# Patient Record
Sex: Male | Born: 1960 | Race: White | Hispanic: No | Marital: Married | Smoking: Never smoker
Health system: Southern US, Community
[De-identification: ages and names within clinical notes are randomized; demographics above are authoritative.]

## PROBLEM LIST (undated history)

## (undated) DIAGNOSIS — E119 Type 2 diabetes mellitus without complications: Secondary | ICD-10-CM

## (undated) DIAGNOSIS — E785 Hyperlipidemia, unspecified: Secondary | ICD-10-CM

## (undated) HISTORY — PX: ELBOW SURGERY: SHX618

## (undated) HISTORY — PX: FINGER SURGERY: SHX640

---

## 2006-03-11 ENCOUNTER — Emergency Department: Payer: Self-pay | Admitting: Internal Medicine

## 2007-08-09 ENCOUNTER — Ambulatory Visit: Payer: Self-pay | Admitting: Internal Medicine

## 2007-08-10 ENCOUNTER — Ambulatory Visit: Payer: Self-pay | Admitting: Internal Medicine

## 2010-10-01 ENCOUNTER — Emergency Department: Payer: Self-pay | Admitting: Emergency Medicine

## 2011-01-25 ENCOUNTER — Emergency Department: Payer: Self-pay | Admitting: Emergency Medicine

## 2013-05-10 DIAGNOSIS — N529 Male erectile dysfunction, unspecified: Secondary | ICD-10-CM | POA: Insufficient documentation

## 2013-05-10 DIAGNOSIS — E6609 Other obesity due to excess calories: Secondary | ICD-10-CM | POA: Insufficient documentation

## 2013-05-10 DIAGNOSIS — E1169 Type 2 diabetes mellitus with other specified complication: Secondary | ICD-10-CM | POA: Insufficient documentation

## 2013-05-17 ENCOUNTER — Ambulatory Visit: Payer: Self-pay | Admitting: Physician Assistant

## 2014-02-13 DIAGNOSIS — E119 Type 2 diabetes mellitus without complications: Secondary | ICD-10-CM | POA: Insufficient documentation

## 2014-10-08 ENCOUNTER — Ambulatory Visit: Payer: Self-pay | Admitting: Family Medicine

## 2014-11-20 ENCOUNTER — Ambulatory Visit: Payer: Self-pay | Admitting: Family Medicine

## 2016-10-09 ENCOUNTER — Ambulatory Visit
Admission: EM | Admit: 2016-10-09 | Discharge: 2016-10-09 | Disposition: A | Discharge status: Home / Self Care | Payer: Managed Care, Other (non HMO) | Attending: Family Medicine | Admitting: Family Medicine

## 2016-10-09 DIAGNOSIS — R110 Nausea: Secondary | ICD-10-CM | POA: Diagnosis not present

## 2016-10-09 DIAGNOSIS — A09 Infectious gastroenteritis and colitis, unspecified: Secondary | ICD-10-CM | POA: Diagnosis not present

## 2016-10-09 DIAGNOSIS — R197 Diarrhea, unspecified: Secondary | ICD-10-CM

## 2016-10-09 HISTORY — DX: Type 2 diabetes mellitus without complications: E11.9

## 2016-10-09 HISTORY — DX: Hyperlipidemia, unspecified: E78.5

## 2016-10-09 MED ORDER — ONDANSETRON 8 MG PO TBDP: 8.0000 mg | ORAL_TABLET | Freq: Two times a day (BID) | ORAL | 0 refills | Status: DC

## 2016-10-09 MED ORDER — ONDANSETRON 8 MG PO TBDP
8.0000 mg | ORAL_TABLET | Freq: Once | ORAL | Status: AC
  Administered 2016-10-09: 8 mg via ORAL

## 2016-10-09 NOTE — ED Triage Notes (Signed)
Patient complains of nausea and vomiting with diarrhea that occurred last night around 3:40am. Patient reports last emesis at 5am. Patient reports that he went to dinner last night and was feeling fine, went home and went to sleep and then woke up with symptoms. Patient reports that he just feels nauseated now.

## 2016-10-09 NOTE — ED Provider Notes (Signed)
CSN: [id number]     Arrival date & time 10/09/16  2956 History   First MD Initiated Contact with Patient 10/09/16 1047     Chief Complaint  Patient presents with  . Nausea   (Consider location/radiation/quality/duration/timing/severity/associated sxs/prior Treatment) HPI  56 year old male who states that he ate at the [employer] last night went to bed around midnight and awoke at about 340 with severe vomiting and diarrhea. He also has some left upper quadrant discomfort. She vomited until around 5 AM and watery diarrhea until about 7 AM. There was no blood or mucus in the stools. Able to drink a glass of water around 6 and although it stimulated diarrhea he did not have any vomiting since then. He continues to have some nausea. He has some mild vague left upper quadrant pain that seems to be residual from last night. His wife did not become ill after her meal. He did eat different entres.      Past Medical History:  Diagnosis Date  . Diabetes (HCC)   . Hyperlipidemia    Past Surgical History:  Procedure Laterality Date  . ELBOW SURGERY Bilateral   . FINGER SURGERY Left    index   Family History  Problem Relation Age of Onset  . Heart disease Mother   . Parkinson's disease Mother   . Dementia Mother   . Heart disease Father   . Kidney failure Father    Social History  Substance Use Topics  . Smoking status: Never Smoker  . Smokeless tobacco: Never Used  . Alcohol use No    Review of Systems  Constitutional: Positive for activity change and appetite change. Negative for chills, fatigue and fever.  Gastrointestinal: Positive for abdominal pain, diarrhea, nausea and vomiting. Negative for blood in stool.  All other systems reviewed and are negative.   Allergies  Patient has no known allergies.  Home Medications   Prior to Admission medications   Medication Sig Start Date End Date Taking? Authorizing Provider  ezetimibe (ZETIA) 10 MG tablet Take 10 mg by mouth  daily.   Yes Historical Provider, MD  glipiZIDE (GLUCOTROL) 5 MG tablet Take by mouth daily before breakfast.   Yes Historical Provider, MD  metFORMIN (GLUCOPHAGE) 500 MG tablet Take 1,000 mg by mouth 2 (two) times daily with a meal.   Yes Historical Provider, MD  pioglitazone (ACTOS) 15 MG tablet Take 15 mg by mouth daily.   Yes Historical Provider, MD  simvastatin (ZOCOR) 80 MG tablet Take 80 mg by mouth daily.   Yes Historical Provider, MD  ondansetron (ZOFRAN ODT) 8 MG disintegrating tablet Take 1 tablet (8 mg total) by mouth 2 (two) times daily. 10/09/16   Lutricia Feil, PA-C   Meds Ordered and Administered this Visit   Medications  ondansetron (ZOFRAN-ODT) disintegrating tablet 8 mg (8 mg Oral Given 10/09/16 1042)    BP 114/76 (BP Location: Left Arm)   Pulse 90   Temp 98.4 F (36.9 C) (Oral)   Resp 18   Ht 5' 11.5" (1.816 m)   Wt 242 lb (109.8 kg)   SpO2 100%   BMI 33.28 kg/m  No data found.   Physical Exam  Constitutional: He is oriented to person, place, and time. He appears well-developed and well-nourished. No distress.  HENT:  Head: Normocephalic and atraumatic.  Right Ear: External ear normal.  Left Ear: External ear normal.  Nose: Nose normal.  Mouth/Throat: Oropharynx is clear and moist. No oropharyngeal exudate.  Eyes: EOM  are normal. Pupils are equal, round, and reactive to light.  Neck: Normal range of motion. Neck supple.  Pulmonary/Chest: Effort normal and breath sounds normal. No respiratory distress. He has no wheezes. He has no rales.  Abdominal: Soft. Bowel sounds are normal. He exhibits no distension and no mass. There is tenderness. There is no rebound and no guarding.  Examination the abdomen shows no distention present. Sounds are present and active in all quadrants. he does have some mild tenderness in the left upper quadrant to epigastric region. There is no rebound no guarding present.  Musculoskeletal: Normal range of motion.  Neurological: He is  alert and oriented to person, place, and time.  Skin: Skin is warm and dry. He is not diaphoretic.  Psychiatric: He has a normal mood and affect. His behavior is normal. Judgment and thought content normal.  Nursing note and vitals reviewed.   Urgent Care Course     Procedures (including critical care time)  Labs Review Labs Reviewed - No data to display  Imaging Review No results found.   Visual Acuity Review  Right Eye Distance:   Left Eye Distance:   Bilateral Distance:    Right Eye Near:   Left Eye Near:    Bilateral Near:     Medications  ondansetron (ZOFRAN-ODT) disintegrating tablet 8 mg (8 mg Oral Given 10/09/16 1042)      MDM   1. Nausea   2. Diarrhea of presumed infectious origin    Discharge Medication List as of 10/09/2016 11:34 AM    START taking these medications   Details  ondansetron (ZOFRAN ODT) 8 MG disintegrating tablet Take 1 tablet (8 mg total) by mouth 2 (two) times daily., Starting Sat 10/09/2016, Normal      Plan: 1. Test/x-ray results and diagnosis reviewed with patient 2. rx as per orders; risks, benefits, potential side effects reviewed with patient 3. Recommend supportive treatment withIncreased fluids. And was given a fluid challenge here today which she tolerated very well after receiving Zofran. Prescribed him additional Zofran for the next couple of days. In that hydration is the key for fluid replacement. When he is able to tolerate solid foods he should start with the brat diet and advance as tolerated. He continues to have difficulties he should follow-up with his primary care physician next week. He worsens in the meantime he should go to emergency room or return to our clinic 4. F/u prn if symptoms worsen or don't improve     Lutricia FeilWilliam P Johanan Skorupski, PA-C 10/09/16 1137

## 2016-12-17 ENCOUNTER — Ambulatory Visit
Admission: EM | Admit: 2016-12-17 | Discharge: 2016-12-17 | Disposition: A | Discharge status: Home / Self Care | Payer: Managed Care, Other (non HMO) | Attending: Physician Assistant | Admitting: Physician Assistant

## 2016-12-17 DIAGNOSIS — J40 Bronchitis, not specified as acute or chronic: Secondary | ICD-10-CM | POA: Diagnosis not present

## 2016-12-17 DIAGNOSIS — R0981 Nasal congestion: Secondary | ICD-10-CM | POA: Diagnosis not present

## 2016-12-17 MED ORDER — PREDNISONE 20 MG PO TABS: 20.0000 mg | ORAL_TABLET | Freq: Two times a day (BID) | ORAL | 0 refills | Status: AC

## 2016-12-17 MED ORDER — AZITHROMYCIN 250 MG PO TABS: 250.0000 mg | ORAL_TABLET | ORAL | 0 refills | Status: DC

## 2016-12-17 NOTE — Discharge Instructions (Signed)
-  complete antibiotics. Two tablets the first day followed by one tablet a day the next four days -prednisone. One  tablet by mouth twice a day for four days -can use over the counter nasal spray like Afrin or Flonase for allergy like symptoms or saline nasal spray for nasal congestion -return to clinic if symptoms do not improve or worsen -go to ER if you develop shortness of breath or increase work of breathing

## 2016-12-17 NOTE — ED Triage Notes (Signed)
Pt states he thought he was dealing with some allergies, but it continues to progress to eye irritation, cough, congestion, facial pressure, headache, difficulty breathing through his nose.

## 2016-12-17 NOTE — ED Provider Notes (Signed)
[facility] ____________________________________________  Time seen: Approximately 12:31 PM  I have reviewed the triage vital signs and the nursing notes.   HISTORY  Chief Complaint Cough   HPI Philip Frye is a 56 y.o. male with past history of seasonal allergies and bronchitis. Patient states he began having upper respiratory allergy-like symptoms last week while working in the yard. Today, he feels like these symptoms are continuing to worsen and that things have moved down into his chest. Patient does report a history of bronchitis in the past. Patient states he went to the nurse at work this morning who told him he may have bronchitis after listening to his breathing. Patient reports nonproductive cough that is associated with some soreness and burning of chest. Patient also reports some sinus congestion and difficulty sleeping. Patient reports he has sleep apnea and is supposed to use a CPAP at night, however he has been able to visualize 2 nights due to his sinus congestion and inability to breathe through his nose. Patient has been taking Zyrtec and Mucinex DM with some help.   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  Denies chest pain, shortness of breath, abdominal pain, dysuria, extremity pain, extremity swelling or rash. Denies recent sickness. Denies recent antibiotic use.    Past Medical History:  Diagnosis Date  . Diabetes (HCC)   . Hyperlipidemia     There are no active problems to display for this patient.   Past Surgical History:  Procedure Laterality Date  . ELBOW SURGERY Bilateral   . FINGER SURGERY Left    index     No current facility-administered medications for this encounter.   Current Outpatient Prescriptions:  .  ezetimibe (ZETIA) 10 MG tablet, Take 10 mg by mouth daily., Disp: , Rfl:  .  glipiZIDE (GLUCOTROL) 5 MG tablet, Take by mouth daily before breakfast., Disp: , Rfl:  .  metFORMIN  (GLUCOPHAGE) 500 MG tablet, Take 1,000 mg by mouth 2 (two) times daily with a meal., Disp: , Rfl:  .  pioglitazone (ACTOS) 15 MG tablet, Take 15 mg by mouth daily., Disp: , Rfl:  .  simvastatin (ZOCOR) 80 MG tablet, Take 80 mg by mouth daily., Disp: , Rfl:  .  azithromycin (ZITHROMAX Z-PAK) 250 MG tablet, Take 1 tablet (250 mg total) by mouth as directed. Take 2 tablets by mouth on day one followed by one tablet daily for the next four days., Disp: 6 tablet, Rfl: 0 .  predniSONE (DELTASONE) 20 MG tablet, Take 1 tablet (20 mg total) by mouth 2 (two) times daily with a meal., Disp: 8 tablet, Rfl: 0  Allergies Patient has no known allergies.  Family History  Problem Relation Age of Onset  . Heart disease Mother   . Parkinson's disease Mother   . Dementia Mother   . Heart disease Father   . Kidney failure Father     Social History Social History  Substance Use Topics  . Smoking status: Never Smoker  . Smokeless tobacco: Never Used  . Alcohol use No    Review of Systems Constitutional: No fever/chills Eyes: No visual changes. ENT: As noted above in history of present illness Cardiovascular: Denies chest pain. Respiratory: As noted above history of present illness Gastrointestinal: No abdominal pain.  No nausea, no vomiting.  No diarrhea.  No constipation. Genitourinary: Negative for dysuria. Musculoskeletal: Negative for back pain. Skin: Negative for rash. Neurological: Negative for headaches, focal weakness or numbness.  10-point ROS otherwise negative.  ____________________________________________   PHYSICAL EXAM:  VITAL SIGNS: ED Triage Vitals  Enc Vitals Group     BP 12/17/16 1149 122/75     Pulse Rate 12/17/16 1149 75     Resp 12/17/16 1149 18     Temp 12/17/16 1149 98.9 F (37.2 C)     Temp Source 12/17/16 1149 Oral     SpO2 12/17/16 1149 100 %     Weight 12/17/16 1149 234 lb (106.1 kg)     Height 12/17/16 1149  (1.803 m)     Head Circumference --       Peak Flow --      Pain Score 12/17/16 1151 7     Pain Loc --      Pain Edu? --      Excl. in GC? --     Constitutional: Alert and oriented. Well appearing and in no acute distress. Eyes: Conjunctivae are normal. PERRL. EOMI. ENT      Head: Normocephalic and atraumatic.      Nose: Congestion, frontal sinus "tightness"      Mouth/Throat: Mucous membranes are moist.Oropharynx non-erythematous. Neck: No stridor. Supple without meningismus.  Hematological/Lymphatic/Immunilogical: No cervical lymphadenopathy. Cardiovascular: Normal rate, regular rhythm. Grossly normal heart sounds.  Respiratory: Normal respiratory effort without tachypnea nor retractions. Coarse breath sounds with rhonchus wheeze on the right Gastrointestinal: Soft and nontender. No distention. Normal Bowel sounds. No CVA tenderness. Musculoskeletal:  Nontender with normal range of motion in all extremities.  Neurologic:  Normal speech and language. No gross focal neurologic deficits are appreciated. Speech is normal. No gait instability.  Skin:  Skin is warm, dry and intact. No rash noted. Psychiatric: Mood and affect are normal. Speech and behavior are normal. Patient exhibits appropriate insight and judgment   ___________________________________________   LABS (all labs ordered are listed, but only abnormal results are displayed)  Labs Reviewed - No data to display _____________________________________________  RADIOLOGY  No results found. ____________________________________________   PROCEDURES Procedures   Procedure(s) performed: None ____________________________________________   FINAL CLINICAL IMPRESSION(S) / ED DIAGNOSES  Final diagnoses:  Bronchitis  Sinus congestion     New Prescriptions   AZITHROMYCIN (ZITHROMAX Z-PAK) 250 MG TABLET    Take 1 tablet (250 mg total) by mouth as directed. Take 2 tablets by mouth on day one followed by one tablet daily for the next four days.   PREDNISONE  (DELTASONE) 20 MG TABLET    Take 1 tablet (20 mg total) by mouth 2 (two) times daily with a meal.    Patient presents with upper respiratory symptoms that started about a week ago after doing her work and has not progressed to moving into his chest. Patient was treated with Zyrtec and Mucinex DM for what he thought were allergy symptoms but this symptoms she continued to worsen. Patient does have some sinus pressure and "tightness" to the frontal sinuses upon palpation. Patient does have some rhonchorous wheezing on the right. We'll discharge patient with a 5 day course of azithromycin as well as a four-day course of prednisone.  Discussed follow up and return parameters including no resolution or any worsening concerns. Patient verbalized understanding and agreed to plan.  Note: This dictation was prepared with Dragon dictation along with smaller phrase technology. Any transcriptional errors that result from this process are unintentional.      Candis Schatz, PA-C     Candis Schatz, PA-C 12/17/16 1303

## 2017-10-26 ENCOUNTER — Ambulatory Visit
Admission: EM | Admit: 2017-10-26 | Discharge: 2017-10-26 | Disposition: A | Discharge status: Home / Self Care | Payer: Managed Care, Other (non HMO) | Attending: Family Medicine | Admitting: Family Medicine

## 2017-10-26 ENCOUNTER — Other Ambulatory Visit: Payer: Self-pay

## 2017-10-26 DIAGNOSIS — J029 Acute pharyngitis, unspecified: Secondary | ICD-10-CM

## 2017-10-26 DIAGNOSIS — R0981 Nasal congestion: Secondary | ICD-10-CM | POA: Diagnosis not present

## 2017-10-26 DIAGNOSIS — B349 Viral infection, unspecified: Secondary | ICD-10-CM | POA: Diagnosis not present

## 2017-10-26 DIAGNOSIS — R509 Fever, unspecified: Secondary | ICD-10-CM

## 2017-10-26 DIAGNOSIS — R05 Cough: Secondary | ICD-10-CM | POA: Diagnosis not present

## 2017-10-26 LAB — RAPID STREP SCREEN (MED CTR MEBANE ONLY): STREPTOCOCCUS, GROUP A SCREEN (DIRECT): NEGATIVE

## 2017-10-26 MED ORDER — OSELTAMIVIR PHOSPHATE 75 MG PO CAPS: 75.0000 mg | ORAL_CAPSULE | Freq: Two times a day (BID) | ORAL | 0 refills | Status: DC

## 2017-10-26 NOTE — ED Provider Notes (Signed)
[facility]    CSN: [id number] Arrival date & time: 10/26/17  1209     History   Chief Complaint Chief Complaint  Patient presents with  . Sore Throat    HPI Philip Frye is a 57 y.o. male.   The history is provided by the patient.  Sore Throat   URI  Presenting symptoms: congestion, cough, fatigue, fever, rhinorrhea and sore throat   Severity:  Moderate Onset quality:  Sudden Duration:  12 hours Timing:  Constant Progression:  Unchanged Chronicity:  New Relieved by:  None tried Ineffective treatments:  None tried Associated symptoms: myalgias   Associated symptoms: no sinus pain and no wheezing   Risk factors: sick contacts (wife with flu)   Risk factors: not elderly, no chronic cardiac disease, no chronic kidney disease, no chronic respiratory disease, no diabetes mellitus, no immunosuppression, no recent illness and no recent travel     Past Medical History:  Diagnosis Date  . Diabetes (HCC)   . Hyperlipidemia     There are no active problems to display for this patient.   Past Surgical History:  Procedure Laterality Date  . ELBOW SURGERY Bilateral   . FINGER SURGERY Left    index       Home Medications    Prior to Admission medications   Medication Sig Start Date End Date Taking? Authorizing Provider  azithromycin (ZITHROMAX Z-PAK) 250 MG tablet Take 1 tablet (250 mg total) by mouth as directed. Take 2 tablets by mouth on day one followed by one tablet daily for the next four days. 12/17/16   Candis SchatzHarris, Michael D, PA-C  ezetimibe (ZETIA) 10 MG tablet Take 10 mg by mouth daily.    [provider]  glipiZIDE (GLUCOTROL) 5 MG tablet Take by mouth daily before breakfast.    [provider]  metFORMIN (GLUCOPHAGE) 500 MG tablet Take 1,000 mg by mouth 2 (two) times daily with a meal.    [provider]  oseltamivir (TAMIFLU) 75 MG capsule Take 1 capsule (75 mg total) by mouth 2 (two) times daily. 10/26/17   Payton Mccallumonty,  Lyric Hoar, MD  pioglitazone (ACTOS) 15 MG tablet Take 15 mg by mouth daily.    [provider]  simvastatin (ZOCOR) 80 MG tablet Take 80 mg by mouth daily.    [provider]    Family History Family History  Problem Relation Age of Onset  . Heart disease Mother   . Parkinson's disease Mother   . Dementia Mother   . Heart disease Father   . Kidney failure Father     Social History Social History   Tobacco Use  . Smoking status: Never Smoker  . Smokeless tobacco: Never Used  Substance Use Topics  . Alcohol use: No  . Drug use: No     Allergies   Patient has no known allergies.   Review of Systems Review of Systems  Constitutional: Positive for fatigue and fever.  HENT: Positive for congestion, rhinorrhea and sore throat. Negative for sinus pain.   Respiratory: Positive for cough. Negative for wheezing.   Musculoskeletal: Positive for myalgias.     Physical Exam Triage Vital Signs ED Triage Vitals [10/26/17 1216]  Enc Vitals Group     BP 116/73     Pulse Rate 92     Resp 20     Temp 98.1 F (36.7 C)     Temp Source Oral     SpO2 98 %     Weight  223 lb (101.2 kg)     Height 5\' 11"  (1.803 m)     Head Circumference      Peak Flow      Pain Score 5     Pain Loc      Pain Edu?      Excl. in GC?    No data found.  Updated Vital Signs BP 116/73 (BP Location: Left Arm)   Pulse 92   Temp 98.1 F (36.7 C) (Oral)   Resp 20   Ht 5\' 11"  (1.803 m)   Wt 223 lb (101.2 kg)   SpO2 98%   BMI 31.10 kg/m   Visual Acuity Right Eye Distance:   Left Eye Distance:   Bilateral Distance:    Right Eye Near:   Left Eye Near:    Bilateral Near:     Physical Exam  Constitutional: He appears well-developed and well-nourished. No distress.  HENT:  Head: Normocephalic and atraumatic.  Right Ear: Tympanic membrane, external ear and ear canal normal.  Left Ear: Tympanic membrane, external ear and ear canal normal.  Nose: Nose normal.  Mouth/Throat:  Uvula is midline, oropharynx is clear and moist and mucous membranes are normal. No oropharyngeal exudate or tonsillar abscesses.  Eyes: Conjunctivae are normal. Right eye exhibits no discharge. Left eye exhibits no discharge. No scleral icterus.  Neck: Normal range of motion. Neck supple. No tracheal deviation present. No thyromegaly present.  Cardiovascular: Normal rate, regular rhythm and normal heart sounds.  Pulmonary/Chest: Effort normal and breath sounds normal. No stridor. No respiratory distress. He has no wheezes. He has no rales. He exhibits no tenderness.  Lymphadenopathy:    He has no cervical adenopathy.  Neurological: He is alert.  Skin: Skin is warm and dry. No rash noted. He is not diaphoretic.  Nursing note and vitals reviewed.    UC Treatments / Results  Labs (all labs ordered are listed, but only abnormal results are displayed) Labs Reviewed  RAPID STREP SCREEN (NOT AT [facility])  CULTURE, GROUP A STREP [facility])    EKG  EKG Interpretation None       Radiology No results found.  Procedures Procedures (including critical care time)  Medications Ordered in UC Medications - No data to display   Initial Impression / Assessment and Plan / UC Course  I have reviewed the triage vital signs and the nursing notes.  Pertinent labs & imaging results that were available during my care of the patient were reviewed by me and considered in my medical decision making (see chart for details).       Final Clinical Impressions(s) / UC Diagnoses   Final diagnoses:  Viral illness    ED Discharge Orders        Ordered    oseltamivir (TAMIFLU) 75 MG capsule  2 times daily     10/26/17 1329     1. diagnosis reviewed with patient 2. rx as per orders above; reviewed possible side effects, interactions, risks and benefits  3. Recommend supportive treatment with rest, fluids, otc analgesics prn 4. Follow-up prn if symptoms worsen or don't improve   Controlled  Substance Prescriptions [place] Controlled Substance Registry consulted? Not Applicable   Payton Mccallum, MD 10/26/17 [phone]

## 2017-10-26 NOTE — ED Triage Notes (Signed)
Pt reports cough, sore throat, cough and hurts to take a deep breath. Sx starting this a.m. Pain 5/10

## 2017-10-29 LAB — CULTURE, GROUP A STREP (THRC)

## 2017-11-24 ENCOUNTER — Emergency Department
Admission: EM | Admit: 2017-11-24 | Discharge: 2017-11-24 | Disposition: A | Discharge status: Home / Self Care | Payer: Worker's Compensation | Attending: Emergency Medicine | Admitting: Emergency Medicine

## 2017-11-24 DIAGNOSIS — S6991XA Unspecified injury of right wrist, hand and finger(s), initial encounter: Secondary | ICD-10-CM | POA: Diagnosis present

## 2017-11-24 DIAGNOSIS — E119 Type 2 diabetes mellitus without complications: Secondary | ICD-10-CM | POA: Diagnosis not present

## 2017-11-24 DIAGNOSIS — X58XXXA Exposure to other specified factors, initial encounter: Secondary | ICD-10-CM | POA: Diagnosis not present

## 2017-11-24 DIAGNOSIS — Z7984 Long term (current) use of oral hypoglycemic drugs: Secondary | ICD-10-CM | POA: Diagnosis not present

## 2017-11-24 DIAGNOSIS — Y939 Activity, unspecified: Secondary | ICD-10-CM | POA: Diagnosis not present

## 2017-11-24 DIAGNOSIS — Z79899 Other long term (current) drug therapy: Secondary | ICD-10-CM | POA: Insufficient documentation

## 2017-11-24 DIAGNOSIS — Y929 Unspecified place or not applicable: Secondary | ICD-10-CM | POA: Insufficient documentation

## 2017-11-24 DIAGNOSIS — S61411A Laceration without foreign body of right hand, initial encounter: Secondary | ICD-10-CM | POA: Diagnosis not present

## 2017-11-24 DIAGNOSIS — Y990 Civilian activity done for income or pay: Secondary | ICD-10-CM | POA: Insufficient documentation

## 2017-11-24 MED ORDER — CEPHALEXIN 500 MG PO CAPS: 500.0000 mg | ORAL_CAPSULE | Freq: Two times a day (BID) | ORAL | 0 refills | Status: AC

## 2017-11-24 NOTE — ED Notes (Signed)
See downtime documentation 

## 2017-11-24 NOTE — ED Provider Notes (Signed)
[facility] Emergency Department Provider Note _______   First MD Initiated Contact with Patient 11/24/17 0230     (approximate)  I have reviewed the triage vital signs and the nursing notes.   HISTORY  Chief Complaint No chief complaint on file.   HPI Philip Frye is a 57 y.o. male a list of chronic medical conditions presents to the emergency department with an accidental laceration to the posterior aspect of the right hand while at work today.  Past Medical History:  Diagnosis Date  . Diabetes (HCC)   . Hyperlipidemia     There are no active problems to display for this patient.   Past Surgical History:  Procedure Laterality Date  . ELBOW SURGERY Bilateral   . FINGER SURGERY Left    index    Prior to Admission medications   Medication Sig Start Date End Date Taking? Authorizing Provider  azithromycin (ZITHROMAX Z-PAK) 250 MG tablet Take 1 tablet (250 mg total) by mouth as directed. Take 2 tablets by mouth on day one followed by one tablet daily for the next four days. 12/17/16   Candis Schatz, PA-C  cephALEXin (KEFLEX) 500 MG capsule Take 1 capsule (500 mg total) by mouth 2 (two) times daily for 7 days. 11/24/17 12/01/17  Darci Current, MD  ezetimibe (ZETIA) 10 MG tablet Take 10 mg by mouth daily.    [provider]  glipiZIDE (GLUCOTROL) 5 MG tablet Take by mouth daily before breakfast.    [provider]  metFORMIN (GLUCOPHAGE) 500 MG tablet Take 1,000 mg by mouth 2 (two) times daily with a meal.    [provider]  oseltamivir (TAMIFLU) 75 MG capsule Take 1 capsule (75 mg total) by mouth 2 (two) times daily. 10/26/17   Payton Mccallum, MD  pioglitazone (ACTOS) 15 MG tablet Take 15 mg by mouth daily.    [provider]  simvastatin (ZOCOR) 80 MG tablet Take 80 mg by mouth daily.    [provider]    Allergies No known drug allergies  Family History  Problem Relation Age of Onset    . Heart disease Mother   . Parkinson's disease Mother   . Dementia Mother   . Heart disease Father   . Kidney failure Father     Social History Social History   Tobacco Use  . Smoking status: Never Smoker  . Smokeless tobacco: Never Used  Substance Use Topics  . Alcohol use: No  . Drug use: No    Review of Systems Constitutional: No fever/chills Eyes: No visual changes. ENT: No sore throat. Cardiovascular: Denies chest pain. Respiratory: Denies shortness of breath. Gastrointestinal: No abdominal pain.  No nausea, no vomiting.  No diarrhea.  No constipation. Genitourinary: Negative for dysuria. Musculoskeletal: Negative for neck pain.  Negative for back pain. Integumentary: Negative for rash.   neurological: Negative for headaches, focal weakness or numbness.   ____________________________________________   PHYSICAL EXAM:  VITAL SIGNS: ED Triage Vitals [11/24/17 0331]  Enc Vitals Group     BP 125/89     Pulse Rate 77     Resp 17     Temp      Temp src      SpO2 97 %     Weight      Height      Head Circumference      Peak Flow      Pain Score 4     Pain Loc  Pain Edu?      Excl. in GC?     Constitutional: Alert and oriented. Well appearing and in no acute distress. Eyes: Conjunctivae are normal.  Head: Atraumatic.  Neck: No stridor.  Gastrointestinal: Soft and nontender. No distention.  Musculoskeletal: No lower extremity tenderness nor edema. No gross deformities of extremities. Neurologic:  Normal speech and language. No gross focal neurologic deficits are appreciated.  Skin:  5cm linear laceration dorsal aspect of the right hand Psychiatric: Mood and affect are normal. Speech and behavior are normal.      ..Laceration Repair Date/Time: 11/24/2017 5:50 AM Performed by: Darci CurrentBrown, [place] N, MD Authorized by: Darci CurrentBrown, [place] N, MD   Consent:    Consent obtained:  Verbal   Consent given by:  Patient   Risks discussed:  Infection, pain,  retained foreign body, poor cosmetic result and poor wound healing Anesthesia (see MAR for exact dosages):    Anesthesia method:  Local infiltration   Local anesthetic:  Lidocaine 1% w/o epi Laceration details:    Location:  Hand   Length (cm):  5   Depth (mm):  6 Repair type:    Repair type:  Simple Exploration:    Hemostasis achieved with:  Direct pressure   Wound exploration: entire depth of wound probed and visualized     Contaminated: no   Treatment:    Area cleansed with:  Saline and Betadine   Amount of cleaning:  Extensive   Irrigation solution:  Sterile saline   Visualized foreign bodies/material removed: no   Skin repair:    Repair method:  Sutures   Suture size:  5-0   Suture material:  Nylon Approximation:    Approximation:  Close Post-procedure details:    Dressing:  Antibiotic ointment   Patient tolerance of procedure:  Tolerated well, no immediate complications     ____________________________________________   INITIAL IMPRESSION / ASSESSMENT AND PLAN / ED COURSE  As part of my medical decision making, I reviewed the following data within the electronic MEDICAL RECORD NUMBER   57 year old male presented with above-stated history and physical exam secondary to laceration to the dorsal aspect of the right hand.  Sutured wound repaired without any difficulty.  Patient tolerated procedure well  ____________________________________________  FINAL CLINICAL IMPRESSION(S) / ED DIAGNOSES  Final diagnoses:  Laceration of right hand without foreign body, initial encounter     MEDICATIONS GIVEN DURING THIS VISIT:  Medications - No data to display   ED Discharge Orders        Ordered    cephALEXin (KEFLEX) 500 MG capsule  2 times daily     11/24/17 0320       Note:  This document was prepared using Dragon voice recognition software and may include unintentional dictation errors.    Darci CurrentBrown, [place] N, MD 11/24/17 [id number]

## 2018-01-02 ENCOUNTER — Ambulatory Visit
Admission: EM | Admit: 2018-01-02 | Discharge: 2018-01-02 | Disposition: A | Discharge status: Home / Self Care | Payer: 59 | Attending: Family Medicine | Admitting: Family Medicine

## 2018-01-02 ENCOUNTER — Other Ambulatory Visit: Payer: Self-pay

## 2018-01-02 ENCOUNTER — Encounter: Payer: Self-pay | Admitting: Emergency Medicine

## 2018-01-02 DIAGNOSIS — J029 Acute pharyngitis, unspecified: Secondary | ICD-10-CM | POA: Diagnosis not present

## 2018-01-02 DIAGNOSIS — H9202 Otalgia, left ear: Secondary | ICD-10-CM | POA: Diagnosis not present

## 2018-01-02 LAB — RAPID STREP SCREEN (MED CTR MEBANE ONLY): STREPTOCOCCUS, GROUP A SCREEN (DIRECT): NEGATIVE

## 2018-01-02 MED ORDER — LIDOCAINE VISCOUS 2 % MT SOLN: 10.0000 mL | Freq: Three times a day (TID) | OROMUCOSAL | 0 refills | Status: DC | PRN

## 2018-01-02 MED ORDER — AMOXICILLIN-POT CLAVULANATE 875-125 MG PO TABS: 1.0000 | ORAL_TABLET | Freq: Two times a day (BID) | ORAL | 0 refills | Status: DC

## 2018-01-02 NOTE — Discharge Instructions (Signed)
Take medication as prescribed. Rest. Drink plenty of fluids.   Follow up with your primary care physician or Ear, Nose and Throat this week as needed. Return to Urgent care as needed. For worsening pain, difficulty swallowing or breathing proceed directly to Emergency room.

## 2018-01-02 NOTE — ED Provider Notes (Signed)
[facility] ____________________________________________  Time seen: Approximately 8:38 AM  I have reviewed the triage vital signs and the nursing notes.   HISTORY  Chief Complaint Otalgia (left)   HPI Philip Frye is a 57 y.o. male seen for evaluation of sore throat present since yesterday.  Patient reports that he does have some seasonal allergies but this feels different.  Reports yesterday after his grandchild's birthday party he noticed he had sore throat and some left-sided ear discomfort, and reports that that has continued.  Unrelieved with over-the-counter Tylenol, ibuprofen and salt water gargles.  Does continue to eat and drink well overall, but does hurt to swallow.  Denies any pain to the inside of his mouth, dental pain or dental swelling.  Denies oral sores.  Denies known direct sick contacts.  Feels well.  No accompanying cough, congestion at this time. Denies chest pain, shortness of breath, abdominal pain, or rash. Denies recent sickness. Denies recent antibiotic use.   Selina Cooley, MD: PCP   Past Medical History:  Diagnosis Date  . Diabetes (HCC)   . Hyperlipidemia     There are no active problems to display for this patient.   Past Surgical History:  Procedure Laterality Date  . ELBOW SURGERY Bilateral   . FINGER SURGERY Left    index     No current facility-administered medications for this encounter.   Current Outpatient Medications:  .  ezetimibe (ZETIA) 10 MG tablet, Take 10 mg by mouth daily., Disp: , Rfl:  .  glipiZIDE (GLUCOTROL) 5 MG tablet, Take by mouth daily before breakfast., Disp: , Rfl:  .  metFORMIN (GLUCOPHAGE) 500 MG tablet, Take 1,000 mg by mouth 2 (two) times daily with a meal., Disp: , Rfl:  .  pioglitazone (ACTOS) 15 MG tablet, Take 15 mg by mouth daily., Disp: , Rfl:  .  simvastatin (ZOCOR) 80 MG tablet, Take 80 mg by mouth daily., Disp: , Rfl:  .  amoxicillin-clavulanate (AUGMENTIN) 875-125 MG tablet,  Take 1 tablet by mouth every 12 (twelve) hours., Disp: 20 tablet, Rfl: 0 .  lidocaine (XYLOCAINE) 2 % solution, Use as directed 10 mLs in the mouth or throat every 8 (eight) hours as needed (sore throat. gargle and spit as needed for sore throat.)., Disp: 100 mL, Rfl: 0  Allergies Patient has no known allergies.  Family History  Problem Relation Age of Onset  . Heart disease Mother   . Parkinson's disease Mother   . Dementia Mother   . Heart disease Father   . Kidney failure Father     Social History Social History   Tobacco Use  . Smoking status: Never Smoker  . Smokeless tobacco: Never Used  Substance Use Topics  . Alcohol use: No  . Drug use: No    Review of Systems Constitutional: No fever/chills ENT: As above.  Cardiovascular: Denies chest pain. Respiratory: Denies shortness of breath. Gastrointestinal: No abdominal pain.   Musculoskeletal: Negative for back pain. Skin: Negative for rash.   ____________________________________________   PHYSICAL EXAM:  VITAL SIGNS: ED Triage Vitals [01/02/18 0814]  Enc Vitals Group     BP (!) 146/94     Pulse Rate 72     Resp 16     Temp 98.3 F (36.8 C)     Temp Source Oral     SpO2 100 %     Weight 219 lb (99.3 kg)     Height 6' (1.829 m)     Head Circumference  Peak Flow      Pain Score 8     Pain Loc      Pain Edu?      Excl. in GC?    Constitutional: Alert and oriented. Well appearing and in no acute distress. Eyes: Conjunctivae are normal.  Head: Atraumatic. No sinus tenderness to palpation. No swelling. No erythema.  Ears: no erythema, normal TMs bilaterally. No mastoid tenderness bilaterally.   Nose:No nasal congestion   Mouth/Throat: Mucous membranes are moist. Mild to moderate pharyngeal erythema. No exudate.  Left tonsil mild swelling and swelling noted at proximal medial tonsil where conjunction to soft palate, palpated and nontender, no fluctuance, no abscess noted.  No uvular shift or deviation.   No oral lesions noted.  No dental tenderness. Neck: No stridor.  No cervical spine tenderness to palpation. Hematological/Lymphatic/Immunilogical: Mild left anterior cervical lymphadenopathy. Cardiovascular: Normal rate, regular rhythm. Grossly normal heart sounds.  Good peripheral circulation. Respiratory: Normal respiratory effort.  No retractions. No wheezes, rales or rhonchi. Good air movement.  Musculoskeletal: Ambulatory with steady gait. No cervical, thoracic or lumbar tenderness to palpation. Neurologic:  Normal speech and language. No gait instability. Skin:  Skin appears warm, dry and intact. No rash noted. Psychiatric: Mood and affect are normal. Speech and behavior are normal.  ___________________________________________   LABS (all labs ordered are listed, but only abnormal results are displayed)  Labs Reviewed  RAPID STREP SCREEN ([facility] ONLY)  CULTURE, GROUP A STREP [facility])   ____________________________________________   PROCEDURES Procedures    INITIAL IMPRESSION / ASSESSMENT AND PLAN / ED COURSE  Pertinent labs & imaging results that were available during my care of the patient were reviewed by me and considered in my medical decision making (see chart for details).  Very well-appearing patient.  No acute distress. Presenting for evaluation of 1 day of sore throat.  Quick strep negative, will culture.  However due to pharyngeal erythema noted as described above, concern for infection, and will improve with starting oral Augmentin and prn viscous lidocaine.  Discussed very closely monitor.  Follow-up with ENT as needed.  Discussed strictly for any increased swelling, increased pain, difficulty swallowing proceed directly to emergency room as likely would need imaging at that time. Discussed follow up with Primary care physician this week. Discussed follow up and return parameters including no resolution or any worsening concerns. Patient verbalized understanding and  agreed to plan.   ____________________________________________   FINAL CLINICAL IMPRESSION(S) / ED DIAGNOSES  Final diagnoses:  Acute pharyngitis, unspecified etiology     ED Discharge Orders        Ordered    amoxicillin-clavulanate (AUGMENTIN) 875-125 MG tablet  Every 12 hours     01/02/18 0848    lidocaine (XYLOCAINE) 2 % solution  Every 8 hours PRN     01/02/18 0848       Note: This dictation was prepared with Dragon dictation along with smaller phrase technology. Any transcriptional errors that result from this process are unintentional.         Renford Dills, NP 01/02/18 [phone]

## 2018-01-02 NOTE — ED Triage Notes (Signed)
Patient in today c/o left ear pain and sore throat on the left side x 1 day. Patient denies fever. Patient has tried OTC Tylenol, Ibuprofen and gargled with salt water without relief.

## 2018-01-04 LAB — CULTURE, GROUP A STREP (THRC)

## 2020-02-21 ENCOUNTER — Other Ambulatory Visit: Payer: Self-pay

## 2020-02-21 DIAGNOSIS — E119 Type 2 diabetes mellitus without complications: Secondary | ICD-10-CM | POA: Insufficient documentation

## 2020-02-21 DIAGNOSIS — M7918 Myalgia, other site: Secondary | ICD-10-CM | POA: Insufficient documentation

## 2020-02-21 DIAGNOSIS — R112 Nausea with vomiting, unspecified: Secondary | ICD-10-CM | POA: Diagnosis not present

## 2020-02-21 DIAGNOSIS — R1030 Lower abdominal pain, unspecified: Secondary | ICD-10-CM | POA: Diagnosis not present

## 2020-02-21 DIAGNOSIS — R197 Diarrhea, unspecified: Secondary | ICD-10-CM | POA: Diagnosis not present

## 2020-02-21 DIAGNOSIS — R42 Dizziness and giddiness: Secondary | ICD-10-CM | POA: Diagnosis not present

## 2020-02-21 DIAGNOSIS — Z79899 Other long term (current) drug therapy: Secondary | ICD-10-CM | POA: Diagnosis not present

## 2020-02-21 LAB — COMPREHENSIVE METABOLIC PANEL
ALT: 24 U/L (ref 0–44)
AST: 16 U/L (ref 15–41)
Albumin: 3.9 g/dL (ref 3.5–5.0)
Alkaline Phosphatase: 70 U/L (ref 38–126)
Anion gap: 9 (ref 5–15)
BUN: 18 mg/dL (ref 6–20)
CO2: 23 mmol/L (ref 22–32)
Calcium: 8.7 mg/dL — ABNORMAL LOW (ref 8.9–10.3)
Chloride: 106 mmol/L (ref 98–111)
Creatinine, Ser: 0.9 mg/dL (ref 0.61–1.24)
GFR calc Af Amer: >60 mL/min (ref >60)
GFR calc non Af Amer: >60 mL/min (ref >60)
Glucose, Bld: 351 mg/dL — ABNORMAL HIGH (ref 70–99)
Potassium: 4 mmol/L (ref 3.5–5.1)
Sodium: 138 mmol/L (ref 135–145)
Total Bilirubin: 1.4 mg/dL — ABNORMAL HIGH (ref 0.3–1.2)
Total Protein: 7.7 g/dL (ref 6.5–8.1)

## 2020-02-21 LAB — CBC
HCT: 41.2 % (ref 39.0–52.0)
Hemoglobin: 14.7 g/dL (ref 13.0–17.0)
MCH: 30.9 pg (ref 26.0–34.0)
MCHC: 35.7 g/dL (ref 30.0–36.0)
MCV: 86.7 fL (ref 80.0–100.0)
Platelets: 176 10*3/uL (ref 150–400)
RBC: 4.75 MIL/uL (ref 4.22–5.81)
RDW: 12.1 % (ref 11.5–15.5)
WBC: 15.5 10*3/uL — ABNORMAL HIGH (ref 4.0–10.5)
nRBC: 0 % (ref 0.0–0.2)

## 2020-02-21 LAB — LIPASE, BLOOD: Lipase: 27 U/L (ref 11–51)

## 2020-02-21 NOTE — ED Triage Notes (Signed)
Pt comes EMS from work with n/v/d since Monday after second covid shot. Pt states that he had flu like symptoms Monday then felt better Tuesday and now has n/v/d and is unable to go to work.

## 2020-02-21 NOTE — ED Triage Notes (Signed)
Pt in via EMS from work with NV. Pt was given 4mg  of zofran, #22 g in rt hand  348 FSBS. 134/92, 98% RA

## 2020-02-22 ENCOUNTER — Emergency Department: Payer: 59

## 2020-02-22 ENCOUNTER — Emergency Department
Admission: EM | Admit: 2020-02-22 | Discharge: 2020-02-22 | Disposition: A | Discharge status: Home / Self Care | Payer: 59 | Attending: Emergency Medicine | Admitting: Emergency Medicine

## 2020-02-22 DIAGNOSIS — R112 Nausea with vomiting, unspecified: Secondary | ICD-10-CM

## 2020-02-22 LAB — CK: Total CK: 61 U/L (ref 49–397)

## 2020-02-22 MED ORDER — ONDANSETRON HCL 4 MG/2ML IJ SOLN
4.0000 mg | INTRAMUSCULAR | Status: AC
  Administered 2020-02-22: 4 mg via INTRAVENOUS
  Filled 2020-02-22: qty 2

## 2020-02-22 MED ORDER — ONDANSETRON 4 MG PO TBDP: ORAL_TABLET | ORAL | 0 refills | Status: DC

## 2020-02-22 MED ORDER — SODIUM CHLORIDE 0.9 % IV BOLUS
1000.0000 mL | Freq: Once | INTRAVENOUS | Status: AC
  Administered 2020-02-22: 1000 mL via INTRAVENOUS

## 2020-02-22 MED ORDER — IOHEXOL 300 MG/ML  SOLN
100.0000 mL | Freq: Once | INTRAMUSCULAR | Status: AC | PRN
  Administered 2020-02-22: 100 mL via INTRAVENOUS

## 2020-02-22 NOTE — Discharge Instructions (Signed)
We believe your symptoms are caused by either a viral infection or possibly a bad food exposure.  It is also possible that you were having a delayed side effect of your COVID-19 vaccination.  Either way, since your symptoms have improved, and your labs and CT scan are generally reassuring, we feel it is safe for you to go home and follow up with your regular doctor.  Please read the included information and stick to a bland diet for the next two days.  Drink plenty of clear fluids, and if you were provided with a prescription, please take it according to the label instructions.    If you develop any new or worsening symptoms, including persistent vomiting not controlled with medication, fever greater than 101, severe or worsening abdominal pain, or other symptoms that concern you, please return immediately to the Emergency Department.

## 2020-02-22 NOTE — ED Provider Notes (Signed)
[facility] Emergency Department Provider Note  ____________________________________________   First MD Initiated Contact with Patient 02/22/20 [phone]     (approximate)  I have reviewed the triage vital signs and the nursing notes.   HISTORY  Chief Complaint Emesis    HPI Philip Frye is a 59 y.o. male with medical history as listed below which notably includes insulin-dependent diabetes.  He presents for evaluation of episodic but persistent and severe nausea, vomiting, and diarrhea with some lower abdominal pain as well.  He states that he received his second COVID-19 vaccination about  5 days ago.  Overnight that night he felt extremely ill with acute onset and severe nausea, vomiting, diarrhea as well as generalized body aches and subjective fever and chills.  He felt ill all the following day with low energy and persistent symptoms.  However by the next day (now Tuesday) he felt completely better.  He did a lot of outside yard work and then worked a long shift at his job.  The next day he also felt well and worked 16 hours at his job and did a lot of work outside.  Yesterday (Thursday) he felt acutely ill again at work with the same symptoms of nausea, vomiting, and diarrhea, although he did not have the other symptoms of subjective fever and chills.  He felt very lightheaded and weak and wondered if he was dehydrated.  He denies chest pain, shortness of breath, and sharp abdominal pain although he continues to have some aching lower abdominal pain.  He denies dysuria.  Nothing in particular makes the symptoms better or worse.        Past Medical History:  Diagnosis Date  . Diabetes (HCC)   . Hyperlipidemia     There are no problems to display for this patient.   Past Surgical History:  Procedure Laterality Date  . ELBOW SURGERY Bilateral   . FINGER SURGERY Left    index    Prior to Admission medications   Medication Sig Start Date End Date  Taking? Authorizing Provider  simvastatin (ZOCOR) 80 MG tablet Take 80 mg by mouth daily.   Yes [provider]  VICTOZA 18 MG/3ML SOPN Inject 1.8 mg into the skin daily. 01/29/20  Yes [provider]  ondansetron (ZOFRAN ODT) 4 MG disintegrating tablet Allow 1-2 tablets to dissolve in your mouth every 8 hours as needed for nausea/vomiting 02/22/20   Loleta Rose, MD    Allergies Metformin  Family History  Problem Relation Age of Onset  . Heart disease Mother   . Parkinson's disease Mother   . Dementia Mother   . Heart disease Father   . Kidney failure Father     Social History Social History   Tobacco Use  . Smoking status: Never Smoker  . Smokeless tobacco: Never Used  Vaping Use  . Vaping Use: Never used  Substance Use Topics  . Alcohol use: No  . Drug use: No    Review of Systems Constitutional: Generalized body aches and subjective fever/chills several days ago, now improved. Eyes: No visual changes. ENT: No sore throat. Cardiovascular: Denies chest pain. Respiratory: Denies shortness of breath. Gastrointestinal: Episodic but severe nausea, vomiting, and diarrhea, as well as some dull aching lower abdominal pain. Genitourinary: Negative for dysuria. Musculoskeletal: Negative for neck pain.  Negative for back pain. Integumentary: Negative for rash. Neurological: Negative for headaches, focal weakness or numbness.   ____________________________________________   PHYSICAL EXAM:  VITAL SIGNS: ED Triage  Vitals [02/21/20 1849]  Enc Vitals Group     BP 124/75     Pulse Rate 99     Resp 18     Temp 98 F (36.7 C)     Temp Source Oral     SpO2 97 %     Weight 92.5 kg (204 lb)     Height 1.803 m (5\' 11" )     Head Circumference      Peak Flow      Pain Score 3     Pain Loc      Pain Edu?      Excl. in [place]?     Constitutional: Alert and oriented.  Eyes: Conjunctivae are normal.  Head: Atraumatic. Nose: No  congestion/rhinnorhea. Mouth/Throat: Mucous membranes appear generally normal, possibly a little bit tacky or dry. Neck: No stridor.  No meningeal signs.   Cardiovascular: Normal rate, regular rhythm. Good peripheral circulation. Grossly normal heart sounds. Respiratory: Normal respiratory effort.  No retractions. Gastrointestinal: Soft and nondistended.  Tenderness to palpation with some involuntary guarding in the lower abdomen, more noticeable on the right than the left.  Negative Rovsing sign.  Mild tenderness to palpation of the epigastrium.  Negative Murphy sign. Musculoskeletal: No lower extremity tenderness nor edema. No gross deformities of extremities. Neurologic:  Normal speech and language. No gross focal neurologic deficits are appreciated.  Skin:  Skin is warm, dry and intact. Psychiatric: Mood and affect are normal. Speech and behavior are normal.  ____________________________________________   LABS (all labs ordered are listed, but only abnormal results are displayed)  Labs Reviewed  COMPREHENSIVE METABOLIC PANEL - Abnormal; Notable for the following components:      Result Value   Glucose, Bld 351 (*)    Calcium 8.7 (*)    Total Bilirubin 1.4 (*)    All other components within normal limits  CBC - Abnormal; Notable for the following components:   WBC 15.5 (*)    All other components within normal limits  LIPASE, BLOOD  CK  URINALYSIS, COMPLETE (UACMP) WITH MICROSCOPIC   ____________________________________________  EKG  ED ECG REPORT I, Hinda Kehr, the attending physician, personally viewed and interpreted this ECG.  Date: 02/21/2020 EKG Time: 18: 56 Rate: 95 Rhythm: normal sinus rhythm QRS Axis: normal Intervals: normal ST/T Wave abnormalities: normal Narrative Interpretation: no evidence of acute ischemia  ____________________________________________  RADIOLOGY I, Hinda Kehr, personally viewed and evaluated these images (plain radiographs) as  part of my medical decision making, as well as reviewing the written report by the radiologist.  ED MD interpretation: Diverticulosis but without any acute or emergent medical condition.  Official radiology report(s): CT ABDOMEN PELVIS W CONTRAST  Result Date: 02/22/2020 CLINICAL DATA:  Diarrhea and abdominal pain EXAM: CT ABDOMEN AND PELVIS WITH CONTRAST TECHNIQUE: Multidetector CT imaging of the abdomen and pelvis was performed using the standard protocol following bolus administration of intravenous contrast. CONTRAST:  181mL OMNIPAQUE IOHEXOL 300 MG/ML  SOLN COMPARISON:  None. FINDINGS: Lower chest: The visualized heart size within normal limits. No pericardial fluid/thickening. No hiatal hernia. The visualized portions of the lungs are clear. Hepatobiliary: There is diffuse low density seen throughout the liver parenchyma.The main portal vein is patent. No evidence of calcified gallstones, gallbladder wall thickening or biliary dilatation. Pancreas: Fatty atrophy seen throughout the pancreas. No pancreatic ductal dilatation or surrounding inflammatory changes. Spleen: Normal in size without focal abnormality. Adrenals/Urinary Tract: Both adrenal glands appear normal. Bilateral low-density partially exophytic renal cysts are seen within both  kidneys the largest measuring 4 cm in the lower pole of the right kidney. No renal or collecting system calculi. No hydronephrosis. Bladder is unremarkable. Stomach/Bowel: The stomach, small bowel, and colon are normal in appearance. No inflammatory changes, wall thickening, or obstructive findings. Scattered colonic diverticula are noted without diverticulitis. Vascular/Lymphatic: There are no enlarged mesenteric, retroperitoneal, or pelvic lymph nodes. Scattered aortic atherosclerotic calcifications are seen without aneurysmal dilatation. Reproductive: The prostate is unremarkable. Other: No evidence of abdominal wall mass. Small fat containing inguinal hernias are  noted. Musculoskeletal: No acute or significant osseous findings. IMPRESSION: Diverticulosis without diverticulitis. Hepatic steatosis. Aortic Atherosclerosis (ICD10-I70.0). Electronically Signed   By: Jonna Clark M.D.   On: 02/22/2020 05:17    ____________________________________________   PROCEDURES   Procedure(s) performed (including Critical Care):  Procedures   ____________________________________________   INITIAL IMPRESSION / MDM / ASSESSMENT AND PLAN / ED COURSE  As part of my medical decision making, I reviewed the following data within the electronic MEDICAL RECORD NUMBER Nursing notes reviewed and incorporated, Labs reviewed , Old chart reviewed, Notes from prior ED visits and [place] Cont[place] Sub[place] Database   Differential diagnosis includes, but is not limited to, postvaccination reaction, electrolyte abnormality, SBO/ileus, viral gastroenteritis, diverticulitis, appendicitis, other acute intra-abdominal infection.  Patient is generally well-appearing in spite of an exceedingly long wait in the emergency department lobby due to overwhelming ED and hospital volumes.  Patient has been waiting for 10 hours but does state that he vomited several times in the lobby as recently as a couple of hours ago.  He has some persistent nausea at this time and a little bit of abdominal pain.    Vital signs are stable.  Lab work is generally normal other than a leukocytosis of 15.5 which could be the result of the persistent GI symptoms.  Comprehensive metabolic panel is normal other than hyperglycemia.  Given his volume depletion (decreased oral intake and increased losses), I am giving him 1 L normal saline as well as Zofran 4 mg IV.  After that he will be allowed to try p.o. challenge.        Clinical Course as of Feb 21 734  Fri Feb 22, 2020  0529 CT CT scan shows no evidence of acute or emergent abnormality.  He has diverticulosis but no sign of diverticulitis and no other  intra-abdominal infection.   [CF]  0553 Normal CK, no indication of rhabdo.  CK Total: 61 [CF]  0731 Patient has been sleeping.  I woke him up and he says he feels much better.  No residual nausea and he was able to tolerate oral intake in the ED.  I informed him of the results of the CT scan and that there is no acute or emergent medical condition.  He is comfortable with the plan for discharge and outpatient follow-up.  I gave my usual and customary return precautions.   [CF]    Clinical Course User Index [CF] Loleta Rose, MD     ____________________________________________  FINAL CLINICAL IMPRESSION(S) / ED DIAGNOSES  Final diagnoses:  Nausea vomiting and diarrhea     MEDICATIONS GIVEN DURING THIS VISIT:  Medications  sodium chloride 0.9 % bolus 1,000 mL (1,000 mLs Intravenous New Bag/Given 02/22/20 0549)  ondansetron (ZOFRAN) injection 4 mg (4 mg Intravenous Given 02/22/20 0458)  iohexol (OMNIPAQUE) 300 MG/ML solution 100 mL (100 mLs Intravenous Contrast Given 02/22/20 0456)     ED Discharge Orders         Ordered  ondansetron (ZOFRAN ODT) 4 MG disintegrating tablet     Discontinue  Reprint     02/22/20 0731          *Please note:  Kamaal Cast Picking was evaluated in Emergency Department on 02/22/2020 for the symptoms described in the history of present illness. He was evaluated in the context of the global COVID-19 pandemic, which necessitated consideration that the patient might be at risk for infection with the SARS-CoV-2 virus that causes COVID-19. Institutional protocols and algorithms that pertain to the evaluation of patients at risk for COVID-19 are in a state of rapid change based on information released by regulatory bodies including the CDC and federal and state organizations. These policies and algorithms were followed during the patient's care in the ED.  Some ED evaluations and interventions may be delayed as a result of limited staffing during the  pandemic.*  Note:  This document was prepared using Dragon voice recognition software and may include unintentional dictation errors.   Loleta Rose, MD 02/22/20 [phone]

## 2020-02-22 NOTE — ED Notes (Signed)
Pt able to drink 10oz of water without difficulty. PO challenge passed.

## 2020-02-22 NOTE — ED Notes (Signed)
Pt noted sitting in lobby drinking a soda and vomiting; pt has been instructed not to eat or drink

## 2020-05-05 ENCOUNTER — Ambulatory Visit (INDEPENDENT_AMBULATORY_CARE_PROVIDER_SITE_OTHER): Payer: 59 | Admitting: Internal Medicine

## 2020-05-05 DIAGNOSIS — E663 Overweight: Secondary | ICD-10-CM | POA: Diagnosis not present

## 2020-05-05 DIAGNOSIS — Z9989 Dependence on other enabling machines and devices: Secondary | ICD-10-CM | POA: Diagnosis not present

## 2020-05-05 DIAGNOSIS — G4733 Obstructive sleep apnea (adult) (pediatric): Secondary | ICD-10-CM | POA: Diagnosis not present

## 2020-05-05 NOTE — Progress Notes (Signed)
[facility] [address] [place], [place] [address]  Pulmonary Sleep Medicine   Office Visit Note  Patient Name: Philip Frye [facility] DOB: [birth year] MRN [id number]    Chief Complaint: Obstructive Sleep Apnea visit  Brief History:  Philip Frye is seen today for follow up  The patient has a 12 year history of sleep apnea. Patient is not using PAP nightly.  He often misses due to having to sleep in the living room. He often gets home very late and doesn't want to disturb her. The patient feels more rested after sleeping with PAP.  The patient reports benefiting from PAP use. Reported sleepiness is  improved and the Epworth Sleepiness Score is 4 out of 24. The patient rarely take naps. The patient complains of the following: no complaints  The compliance download shows good compliance with an average use time of 6.4 hours. The AHI is 0.2  The patient reports RLS  Symptoms but denies difficulty initiating or maintaining sleep or daytime sleepiness.  ROS  General: (-) fever, (-) chills, (-) night sweat Nose and Sinuses: (-) nasal stuffiness or itchiness, (-) postnasal drip, (-) nosebleeds, (-) sinus trouble. Mouth and Throat: (-) sore throat, (-) hoarseness. Neck: (-) swollen glands, (-) enlarged thyroid, (-) neck pain. Respiratory: - cough, - shortness of breath, - wheezing. Neurologic: - numbness, - tingling. Psychiatric: - anxiety, - depression   Current Medication: Outpatient Encounter Medications as of 05/05/2020  Medication Sig  . liraglutide (VICTOZA) 18 MG/3ML SOPN Inject 1.8 mg into the skin daily.  . metFORMIN (GLUCOPHAGE) 500 MG tablet Take by mouth with breakfast, with lunch, and with evening meal.  . rosuvastatin (CRESTOR) 20 MG tablet Take 20 mg by mouth daily.  . [DISCONTINUED] ondansetron (ZOFRAN ODT) 4 MG disintegrating tablet Allow 1-2 tablets to dissolve in your mouth every 8 hours as needed for nausea/vomiting  . [DISCONTINUED] simvastatin (ZOCOR) 80 MG  tablet Take 80 mg by mouth daily.  . [DISCONTINUED] VICTOZA 18 MG/3ML SOPN Inject 1.8 mg into the skin daily.   No facility-administered encounter medications on file as of 05/05/2020.    Surgical History: Past Surgical History:  Procedure Laterality Date  . ELBOW SURGERY Bilateral   . FINGER SURGERY Left    index    Medical History: Past Medical History:  Diagnosis Date  . Diabetes (HCC)   . Hyperlipidemia     Family History: Non contributory to the present illness  Social History: Social History   Socioeconomic History  . Marital status: Married    Spouse name: Not on file  . Number of children: Not on file  . Years of education: Not on file  . Highest education level: Not on file  Occupational History  . Not on file  Tobacco Use  . Smoking status: Never Smoker  . Smokeless tobacco: Never Used  Vaping Use  . Vaping Use: Never used  Substance and Sexual Activity  . Alcohol use: No  . Drug use: No  . Sexual activity: Not on file  Other Topics Concern  . Not on file  Social History Narrative  . Not on file   Social Determinants of Health   Financial Resource Strain:   . Difficulty of Paying Living Expenses: Not on file  Food Insecurity:   . Worried About Programme researcher, broadcasting/film/video in the Last Year: Not on file  . Ran Out of Food in the Last Year: Not on file  Transportation Needs:   . Lack of Transportation (Medical): Not  on file  . Lack of Transportation (Non-Medical): Not on file  Physical Activity:   . Days of Exercise per Week: Not on file  . Minutes of Exercise per Session: Not on file  Stress:   . Feeling of Stress : Not on file  Social Connections:   . Frequency of Communication with Friends and Family: Not on file  . Frequency of Social Gatherings with Friends and Family: Not on file  . Attends Religious Services: Not on file  . Active Member of Clubs or Organizations: Not on file  . Attends BankerClub or Organization Meetings: Not on file  . Marital  Status: Not on file  Intimate Partner Violence:   . Fear of Current or Ex-Partner: Not on file  . Emotionally Abused: Not on file  . Physically Abused: Not on file  . Sexually Abused: Not on file    Vital Signs: Blood pressure (!) 132/94, pulse 79, height 5\' 11"  (1.803 m), weight 209 lb (94.8 kg), SpO2 99 %.  Examination: General Appearance: The patient is well-developed, well-nourished, and in no distress. Neck Circumference: 46 Skin: Gross inspection of skin unremarkable. Head: normocephalic, no gross deformities. Eyes: no gross deformities noted. ENT: ears appear grossly normal Neurologic: Alert and oriented. No involuntary movements.    EPWORTH SLEEPINESS SCALE:  Scale:  (0)= no chance of dozing; (1)= slight chance of dozing; (2)= moderate chance of dozing; (3)= high chance of dozing  Chance  Situtation    Sitting and reading: 0    Watching TV: 2    Sitting Inactive in public: 0    As a passenger in car: 0      Lying down to rest: 1    Sitting and talking: 0    Sitting quielty after lunch: 1    In a car, stopped in traffic: 0   TOTAL SCORE:   4 out of 24    SLEEP STUDIES:  1. PSG 06/17/08 AHI 24 SpO342min 84%   CPAP COMPLIANCE DATA:  Date Range: 2/27/-21-8/25/21  Average Daily Use: 6.4 hours  Median Use: 6.6  Compliance for > 4 Hours: 79%  AHI: 0.2 respiratory events per hour  Days Used: 152/180  Mask Leak: 8.1  95th Percentile Pressure: 5   LABS: Recent Results (from the past 2160 hour(s))  Lipase, blood     Status: None   Collection Time: 02/21/20  6:58 PM  Result Value Ref Range   Lipase 27 11 - 51 U/L    Comment: Performed at [facility], [address] [place], [place] [id number]  Comprehensive metabolic panel     Status: Abnormal   Collection Time: 02/21/20  6:58 PM  Result Value Ref Range   Sodium 138 135 - 145 mmol/L   Potassium 4.0 3.5 - 5.1 mmol/L   Chloride 106 98 - 111 mmol/L   CO2 23 22 - 32 mmol/L    Glucose, Bld 351 (H) 70 - 99 mg/dL    Comment: Glucose reference range applies only to samples taken after fasting for at least 8 hours.   BUN 18 6 - 20 mg/dL   Creatinine, Ser 8.110.90 0.61 - 1.24 mg/dL   Calcium 8.7 (L) 8.9 - 10.3 mg/dL   Total Protein 7.7 6.5 - 8.1 g/dL   Albumin 3.9 3.5 - 5.0 g/dL   AST 16 15 - 41 U/L   ALT 24 0 - 44 U/L   Alkaline Phosphatase 70 38 - 126 U/L   Total Bilirubin 1.4 (H) 0.3 -  1.2 mg/dL   GFR calc non Af Amer >60 >60 mL/min   GFR calc Af Amer >60 >60 mL/min   Anion gap 9 5 - 15    Comment: Performed at [facility], [address] [place], [place] [address]  CBC     Status: Abnormal   Collection Time: 02/21/20  6:58 PM  Result Value Ref Range   WBC 15.5 (H) 4.0 - 10.5 K/uL   RBC 4.75 4.22 - 5.81 MIL/uL   Hemoglobin 14.7 13.0 - 17.0 g/dL   HCT 10.9 39 - 52 %   MCV 86.7 80.0 - 100.0 fL   MCH 30.9 26.0 - 34.0 pg   MCHC 35.7 30.0 - 36.0 g/dL   RDW 32.3 55.7 - 32.2 %   Platelets 176 150 - 400 K/uL   nRBC 0.0 0.0 - 0.2 %    Comment: Performed at [facility], [address] [place], [place] [address]  CK     Status: None   Collection Time: 02/22/20  4:47 AM  Result Value Ref Range   Total CK 61 49.0 - 397.0 U/L    Comment: Performed at [facility], [address] [place], [place] [address]    Radiology: CT ABDOMEN PELVIS W CONTRAST  Result Date: 02/22/2020 CLINICAL DATA:  Diarrhea and abdominal pain EXAM: CT ABDOMEN AND PELVIS WITH CONTRAST TECHNIQUE: Multidetector CT imaging of the abdomen and pelvis was performed using the standard protocol following bolus administration of intravenous contrast. CONTRAST:  OMNIPAQUE IOHEXOL 300 MG/ML  SOLN COMPARISON:  None. FINDINGS: Lower chest: The visualized heart size within normal limits. No pericardial fluid/thickening. No hiatal hernia. The visualized portions of the lungs are clear. Hepatobiliary: There is diffuse low density seen throughout the liver parenchyma.The  main portal vein is patent. No evidence of calcified gallstones, gallbladder wall thickening or biliary dilatation. Pancreas: Fatty atrophy seen throughout the pancreas. No pancreatic ductal dilatation or surrounding inflammatory changes. Spleen: Normal in size without focal abnormality. Adrenals/Urinary Tract: Both adrenal glands appear normal. Bilateral low-density partially exophytic renal cysts are seen within both kidneys the largest measuring 4 cm in the lower pole of the right kidney. No renal or collecting system calculi. No hydronephrosis. Bladder is unremarkable. Stomach/Bowel: The stomach, small bowel, and colon are normal in appearance. No inflammatory changes, wall thickening, or obstructive findings. Scattered colonic diverticula are noted without diverticulitis. Vascular/Lymphatic: There are no enlarged mesenteric, retroperitoneal, or pelvic lymph nodes. Scattered aortic atherosclerotic calcifications are seen without aneurysmal dilatation. Reproductive: The prostate is unremarkable. Other: No evidence of abdominal wall mass. Small fat containing inguinal hernias are noted. Musculoskeletal: No acute or significant osseous findings. IMPRESSION: Diverticulosis without diverticulitis. Hepatic steatosis. Aortic Atherosclerosis (ICD10-I70.0). Electronically Signed   By: Jonna Clark M.D.   On: 02/22/2020 05:17   Assessment and Plan: Patient Active Problem List   Diagnosis Date Noted  . OSA on CPAP 05/05/2020  . Overweight (BMI 25.0-29.9) 05/05/2020    The patient does tolerate PAP and reports significant benefit from PAP use. The patient was reminded how to clean CPAP and advised to discontinue using the SoClean. The patient is exercising and watching his diet The compliance is good The apnea is well controlled.   1. OSG- continue  Good use and suggesting getting a second CPAP to keep in the living room. 2. BMI 29-Encouraged to focus on healthy eating habits and incorporating exercise into his  daily routine. Discussed the negative side effects obesity has on his overall  health.  General Counseling: I have discussed the findings of the evaluation and examination with Philip Frye.  I have also discussed any further diagnostic evaluation thatmay be needed or ordered today. Calvin verbalizes understanding of the findings of todays visit. We also reviewed his medications today and discussed drug interactions and side effects including but not limited excessive drowsiness and altered mental states. We also discussed that there is always a risk not just to him but also people around him. he has been encouraged to call the office with any questions or concerns that should arise related to todays visit.  This patient was seen by Leeanne Deed AGNP-C in Collaboration with Dr. Freda Munro as a part of collaborative care agreement.  I have personally obtained a history, examined the patient, evaluated laboratory and imaging results, formulated the assessment and plan and placed orders.   Valentino Hue Sol Blazing, PhD, FAASM  Diplomate, American Board of Sleep Medicine    Yevonne Pax, MD [facility] Diplomate ABMS Pulmonary and Critical Care Medicine Sleep medicine

## 2020-05-05 NOTE — Patient Instructions (Signed)

## 2020-11-22 ENCOUNTER — Emergency Department
Admission: EM | Admit: 2020-11-22 | Discharge: 2020-11-22 | Disposition: A | Discharge status: Home / Self Care | Payer: 59 | Attending: Emergency Medicine | Admitting: Emergency Medicine

## 2020-11-22 ENCOUNTER — Other Ambulatory Visit: Payer: Self-pay

## 2020-11-22 ENCOUNTER — Ambulatory Visit
Admission: EM | Admit: 2020-11-22 | Discharge: 2020-11-22 | Disposition: A | Discharge status: Other Acute Inpatient Hospital | Payer: 59 | Attending: Physician Assistant | Admitting: Physician Assistant

## 2020-11-22 DIAGNOSIS — E1165 Type 2 diabetes mellitus with hyperglycemia: Secondary | ICD-10-CM | POA: Insufficient documentation

## 2020-11-22 DIAGNOSIS — R21 Rash and other nonspecific skin eruption: Secondary | ICD-10-CM

## 2020-11-22 DIAGNOSIS — Z7984 Long term (current) use of oral hypoglycemic drugs: Secondary | ICD-10-CM | POA: Diagnosis not present

## 2020-11-22 DIAGNOSIS — T782XXA Anaphylactic shock, unspecified, initial encounter: Secondary | ICD-10-CM

## 2020-11-22 DIAGNOSIS — X58XXXA Exposure to other specified factors, initial encounter: Secondary | ICD-10-CM | POA: Diagnosis not present

## 2020-11-22 DIAGNOSIS — R0602 Shortness of breath: Secondary | ICD-10-CM | POA: Diagnosis not present

## 2020-11-22 DIAGNOSIS — E1169 Type 2 diabetes mellitus with other specified complication: Secondary | ICD-10-CM | POA: Diagnosis not present

## 2020-11-22 DIAGNOSIS — E785 Hyperlipidemia, unspecified: Secondary | ICD-10-CM | POA: Insufficient documentation

## 2020-11-22 DIAGNOSIS — R0789 Other chest pain: Secondary | ICD-10-CM

## 2020-11-22 DIAGNOSIS — R739 Hyperglycemia, unspecified: Secondary | ICD-10-CM

## 2020-11-22 DIAGNOSIS — I959 Hypotension, unspecified: Secondary | ICD-10-CM

## 2020-11-22 DIAGNOSIS — Y990 Civilian activity done for income or pay: Secondary | ICD-10-CM | POA: Insufficient documentation

## 2020-11-22 LAB — CBG MONITORING, ED: Glucose-Capillary: 241 mg/dL — ABNORMAL HIGH (ref 70–99)

## 2020-11-22 MED ORDER — EPINEPHRINE 0.3 MG/0.3ML IJ SOAJ: 0.3000 mg | INTRAMUSCULAR | 0 refills | Status: AC | PRN

## 2020-11-22 MED ORDER — EPINEPHRINE 0.3 MG/0.3ML IJ SOAJ
0.3000 mg | Freq: Once | INTRAMUSCULAR | Status: AC
  Administered 2020-11-22: 0.3 mg via INTRAMUSCULAR

## 2020-11-22 MED ORDER — METHYLPREDNISOLONE SODIUM SUCC 125 MG IJ SOLR
125.0000 mg | Freq: Once | INTRAMUSCULAR | Status: AC
  Administered 2020-11-22: 125 mg via INTRAVENOUS
  Filled 2020-11-22: qty 2

## 2020-11-22 MED ORDER — SODIUM CHLORIDE 0.9 % IV SOLN: Freq: Once | INTRAVENOUS | Status: AC

## 2020-11-22 NOTE — ED Provider Notes (Signed)
[facility] Emergency Department Provider Note  ____________________________________________   Event Date/Time   First MD Initiated Contact with Patient 11/22/20 1454     (approximate)  I have reviewed the triage vital signs and the nursing notes.   HISTORY  Chief Complaint Allergic Reaction   HPI Philip Frye is a 60 y.o. male with past medical history of DM, HDL, OSA, and previous allergic reactions requiring epinephrine who presents via EMS from urgent care where he initially presented after he developed some hives chest tightness nausea and diarrhea while at work.  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  He states he has had reactions in the past that involved full body hives that seem to go away with Benadryl.  He has never had anything this bad.  He reportedly syncopized immediate prior to arrival to urgent care.  He states he does remember getting fluids and epinephrine at urgent care and currently feels much better and states he has no itching anymore, shortness of breath, itchy eyes, nausea or feeling okay has no diarrhea.  States he currently has no symptoms at all other than feeling tired.  He states that prior to onset this he was in his usual state health without any recent chest pain, fevers, chills, cough, vomiting or diarrhea, dysuria, recent injuries or falls or any other acute complaints.  Denies tobacco abuse, EtOH use or illicit drug use.  Has not received EpiPen before.  No new drugs, foods or other exposures other than work trips but suspects is likely where he was exposed to something that caused this.         Past Medical History:  Diagnosis Date  . Diabetes (HCC)   . Hyperlipidemia     Patient Active Problem List   Diagnosis Date Noted  . OSA on CPAP 05/05/2020  . Overweight (BMI 25.0-29.9) 05/05/2020    Past Surgical History:  Procedure Laterality Date  . ELBOW SURGERY  Bilateral   . FINGER SURGERY Left    index    Prior to Admission medications   Medication Sig Start Date End Date Taking? Authorizing Provider  EPINEPHrine (EPIPEN 2-PAK) 0.3 mg/0.3 mL IJ SOAJ injection Inject 0.3 mg into the muscle as needed for anaphylaxis. 11/22/20  Yes Gilles Chiquito, MD  liraglutide (VICTOZA) 18 MG/3ML SOPN Inject 1.8 mg into the skin daily.    [provider]  metFORMIN (GLUCOPHAGE) 500 MG tablet Take by mouth with breakfast, with lunch, and with evening meal.    [provider]  rosuvastatin (CRESTOR) 20 MG tablet Take 20 mg by mouth daily.    [provider]    Allergies Metformin  Family History  Problem Relation Age of Onset  . Heart disease Mother   . Parkinson's disease Mother   . Dementia Mother   . Heart disease Father   . Kidney failure Father     Social History Social History   Tobacco Use  . Smoking status: Never Smoker  . Smokeless tobacco: Never Used  Vaping Use  . Vaping Use: Never used  Substance Use Topics  . Alcohol use: No  . Drug use: No    Review of Systems  Review of Systems  Constitutional: Negative for chills and fever.  HENT: Negative for sore throat.   Eyes: Negative for pain.  Respiratory: Positive for cough and shortness of breath. Negative for stridor.   Cardiovascular: Negative for chest pain.  Gastrointestinal:  Positive for diarrhea and nausea. Negative for vomiting.  Genitourinary: Negative for dysuria.  Musculoskeletal: Positive for myalgias.  Skin: Positive for itching and rash.  Neurological: Negative for seizures, loss of consciousness and headaches.  Psychiatric/Behavioral: Negative for suicidal ideas.  All other systems reviewed and are negative.     ____________________________________________   PHYSICAL EXAM:  VITAL SIGNS: ED Triage Vitals  Enc Vitals Group     BP      Pulse      Resp      Temp      Temp src      SpO2      Weight      Height      Head  Circumference      Peak Flow      Pain Score      Pain Loc      Pain Edu?      Excl. in GC?    Vitals:   11/22/20 1800 11/22/20 1900  BP: (!) 154/99 131/80  Pulse:  86  Resp: 12 18  Temp:    SpO2:  97%   Physical Exam Vitals and nursing note reviewed.  Constitutional:      Appearance: He is well-developed.  HENT:     Head: Normocephalic and atraumatic.     Right Ear: External ear normal.     Left Ear: External ear normal.     Nose: Nose normal.  Eyes:     Conjunctiva/sclera: Conjunctivae normal.  Cardiovascular:     Rate and Rhythm: Normal rate and regular rhythm.     Heart sounds: No murmur heard.   Pulmonary:     Effort: Pulmonary effort is normal. No respiratory distress.     Breath sounds: Normal breath sounds.  Abdominal:     Palpations: Abdomen is soft.     Tenderness: There is no abdominal tenderness.  Musculoskeletal:     Cervical back: Neck supple.  Skin:    General: Skin is warm and dry.  Neurological:     Mental Status: He is alert and oriented to person, place, and time.  Psychiatric:        Mood and Affect: Mood normal.     No obvious rash visualized over the patient's extremities chest abdomen or back.  Abdomen is soft.  Oropharynx is unremarkable.  ____________________________________________   LABS (all labs ordered are listed, but only abnormal results are displayed)  Labs Reviewed  CBG MONITORING, ED - Abnormal; Notable for the following components:      Result Value   Glucose-Capillary 241 (*)    All other components within normal limits   ____________________________________________  EKG  Sinus rhythm with a ventricular of 81, normal axis, unremarkable intervals and no clear evidence of acute ischemia or other significant delay arrhythmia. ____________________________________________  RADIOLOGY  ED MD interpretation:    Official radiology report(s): No results  found.  ____________________________________________   PROCEDURES  Procedure(s) performed (including Critical Care):  .1-3 Lead EKG Interpretation Performed by: Gilles Chiquito, MD Authorized by: Gilles Chiquito, MD     Interpretation: normal     ECG rate assessment: normal     Rhythm: sinus rhythm     Ectopy: none     Conduction: normal       ____________________________________________   INITIAL IMPRESSION /with AND PLAN / ED COURSE      Patient presents with above to history exam via EMS from urgent care for assessment with concerns for possible anaphylaxis.  Patient  poorly started developing hives and generalized itching nausea diarrhea and shortness of breath while at work.  He was hypotensive and mildly hypoxic as well as tachypneic at urgent care.  He was given IM epi and famotidine as well as some some Benadryl and mild by EMS.  On arrival he states his symptoms have essentially completely resolved.  He is hemodynamically stable on room air.  He has no obvious rash on exam wheezing and states he is not nauseous and does not have any diarrhea.  Very low suspicion for acute infectious process, trauma, significant metabolic derangement or toxic ingestion.  Suspect likely anaphylactic reaction.  Especially since patient states he has had hives something unknown which he suspects in his work environment.  He was given some Solu-Medrol as he not received this yet.  He was observed approximately 6 hours from time of epi administration and did not require repeat dosing.  In addition on multiple assessments he had clear lungs and denied any complaints.  Given stable vitals no recurrence of symptoms and otherwise reassuring exam over 6 hours from time of epi administration I believe he is safe for discharge with plan for outpatient PCP follow-up.  EpiPen prescription written.  Strict return precautions advised and discussed.  Advised him that his blood sugar was slightly elevated today  and that he sent this rechecked by his PCP.       ____________________________________________   FINAL CLINICAL IMPRESSION(S) / ED DIAGNOSES  Final diagnoses:  Anaphylaxis, initial encounter  Hyperglycemia    Medications  methylPREDNISolone sodium succinate (SOLU-MEDROL) 125 mg/2 mL injection 125 mg (125 mg Intravenous Given 11/22/20 1630)     ED Discharge Orders         Ordered    EPINEPHrine (EPIPEN 2-PAK) 0.3 mg/0.3 mL IJ SOAJ injection  As needed        11/22/20 1824           Note:  This document was prepared using Dragon voice recognition software and may include unintentional dictation errors.   Gilles Chiquito, MD 11/22/20 [phone]

## 2020-11-22 NOTE — ED Triage Notes (Signed)
Pt via EMS from [facility]. Pt was sent from his job to [facility] for possible allergic reaction. Pt had hives, SOB, difficulty swallowing. When pt arrived to [facility] and had a syncopal episode where he fell on the grass. Pt initial BP was 60/39. EMS gave pt EPIpen, 20mg  of Pepcid, and 50 of Benadryl. On arrival, pt is A&Ox4 and NAD. No hives or rashes noted. Per pt, this has happened before.

## 2020-11-22 NOTE — ED Notes (Signed)
Patient is being discharged from the Urgent Care and sent to the Emergency Department via EMS . Per Athena Masse, PA, patient is in need of higher level of care due to anaphylaxis reaction. Patient is aware and verbalizes understanding of plan of care.  Vitals:   11/22/20 1400 11/22/20 1415  BP: (!) 65/52 101/70  Pulse: 87 80  Resp: (!) 22 20  SpO2: 90% 98%

## 2020-11-22 NOTE — ED Notes (Signed)
Pt explained to registration that pt is a workers comp case. Asked pt if he had a photo ID, pt does not have it at this time. Pt states that he will tell his family member to go get his photo ID because he would otherwise be ineligible for workers comp.

## 2020-11-22 NOTE — ED Notes (Signed)
Pepcid 20mg  given IV

## 2020-11-22 NOTE — ED Notes (Signed)
Workers comp competed

## 2020-11-22 NOTE — ED Provider Notes (Signed)
[facility]    CSN: [id number] Arrival date & time: 11/22/20  1401      History   Chief Complaint Chief Complaint  Patient presents with  . Allergic Reaction  . Shortness of Breath  . Chest Pain    HPI [facility] is a 60 y.o. T2DM  male presenting to [facility] urgent care.  Patient was brought by a coworker for complaining of shortness of breath and itching.  The coworker says that he complained of the symptoms about 30 minutes before arrival to urgent care.  Patient try to walk into the urgent care but ended up falling and briefly losing consciousness.  His coworker said that he fell into the grass.  Patient was then transported back to a trauma room by nursing staff.  He had his eyes closed and complained of light sensitivity.  He also complained of chest tightness and breathing difficulty.  Patient diaphoretic.  He did have a rash of his ventral forearms that was initially mild.  Patient not responding well when we were trying to get a history of his present illness.  His eyes were rolling back into his head and he was complaining of weakness and feeling faint.  Patient hypotensive with BP in the 60s over 40s and initial oxygen at about 90%.  Patient's rash worsened within a couple of minutes and started to affect his face, diffusely throughout his forearms, chest, abdomen and back.   Call placed to 911 immediately out of concern for acute MI or stroke.  Upon more questioning and witnessing the rash worsen, concern for anaphylactic reaction.  Patient given epi IM autoinjector to the left thigh.  2 IVs placed and IV saline began.  Patient given epi at 1400.  Within a couple of minutes he started to speak in full sentences and stated that his breathing was getting better.  We did also place him on 3 L of oxygen.  Oxygen rose to 98% and blood pressure came up to 100s over 60s.    Patient does report similar problem in the past about a year ago and says that no one ever  found out what he had a reaction to.  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  Patient has no known allergies.  Patient stated that the only new medication that he has taken has been tramadol.  He is taking it for pain as he currently is dealing with hip pain.  He has received corticosteroid injections into the hip.  Patient cannot identify any new foods or other medications and has not used any new detergents or perfumes/lotions.  HPI  Past Medical History:  Diagnosis Date  . Diabetes (HCC)   . Hyperlipidemia     Patient Active Problem List   Diagnosis Date Noted  . OSA on CPAP 05/05/2020  . Overweight (BMI 25.0-29.9) 05/05/2020    Past Surgical History:  Procedure Laterality Date  . ELBOW SURGERY Bilateral   . FINGER SURGERY Left    index       Home Medications    Prior to Admission medications   Medication Sig Start Date End Date Taking? Authorizing Provider  liraglutide (VICTOZA) 18 MG/3ML SOPN Inject 1.8 mg into the skin daily.    [provider]  metFORMIN (GLUCOPHAGE) 500 MG tablet Take by mouth with breakfast, with lunch, and with evening meal.    [provider]  rosuvastatin (CRESTOR)  20 MG tablet Take 20 mg by mouth daily.    [provider]    Family History Family History  Problem Relation Age of Onset  . Heart disease Mother   . Parkinson's disease Mother   . Dementia Mother   . Heart disease Father   . Kidney failure Father     Social History Social History   Tobacco Use  . Smoking status: Never Smoker  . Smokeless tobacco: Never Used  Vaping Use  . Vaping Use: Never used  Substance Use Topics  . Alcohol use: No  . Drug use: No     Allergies   Metformin   Review of Systems Review of Systems  Constitutional: Positive for fatigue.  HENT: Positive for trouble swallowing. Negative for facial swelling.   Eyes: Positive for photophobia.  Respiratory:  Positive for chest tightness and shortness of breath.   Cardiovascular: Positive for chest pain.  Gastrointestinal: Positive for abdominal pain and nausea. Negative for vomiting.  Skin: Positive for rash.  Neurological: Positive for syncope and weakness. Negative for headaches.     Physical Exam Triage Vital Signs ED Triage Vitals  Enc Vitals Group     BP      Pulse      Resp      Temp      Temp src      SpO2      Weight      Height      Head Circumference      Peak Flow      Pain Score      Pain Loc      Pain Edu?      Excl. in GC?    No data found.  Updated Vital Signs BP 101/70 (BP Location: Right Arm)   Pulse 80   Resp 20   SpO2 98%       Physical Exam Vitals and nursing note reviewed.  Constitutional:      General: He is in acute distress.     Appearance: Normal appearance. He is well-developed. He is obese. He is ill-appearing and diaphoretic.  HENT:     Head: Normocephalic and atraumatic.  Eyes:     Conjunctiva/sclera: Conjunctivae normal.  Cardiovascular:     Rate and Rhythm: Normal rate and regular rhythm.     Heart sounds: Normal heart sounds.  Pulmonary:     Effort: Respiratory distress present.     Breath sounds: Normal breath sounds. No wheezing, rhonchi or rales.  Abdominal:     Palpations: Abdomen is soft.     Tenderness: There is no abdominal tenderness.  Musculoskeletal:     Cervical back: Neck supple.  Skin:    General: Skin is warm.     Findings: Rash (erythematous maculopapular rash initially just on anterior forearms and then quickly spread to entire arms, chest, face, abdomen and back ) present.  Neurological:     General: No focal deficit present.     Mental Status: He is lethargic.     GCS: GCS eye subscore is 3. GCS verbal subscore is 5. GCS motor subscore is 6.     Motor: Weakness (generalized) present.     Comments: Patient unresponsive outside and then started to respond in one or two words. Oriented x 3. Continued rapid  improvement in responsiveness after administration of epi  Patient seemed to have left sided weakness initially and was making abnormal movements with his mouth, sticking his tongue out and licking his lips.  Upon more questioning it seemed like he was having swelling of his tongue and difficulty swallowing.  Psychiatric:        Mood and Affect: Mood normal.      UC Treatments / Results  Labs (all labs ordered are listed, but only abnormal results are displayed) Labs Reviewed - No data to display  EKG   Radiology No results found.  Procedures Procedures (including critical care time)  Medications Ordered in UC Medications  0.9 %  sodium chloride infusion ( Intravenous New Bag/Given 11/22/20 1402)  EPINEPHrine (EPI-PEN) injection 0.3 mg (0.3 mg Intramuscular Given 11/22/20 1404)    Initial Impression / Assessment and Plan / UC Course  I have reviewed the triage vital signs and the nursing notes.  Pertinent labs & imaging results that were available during my care of the patient were reviewed by me and considered in my medical decision making (see chart for details).   60 year old male brought in by coworker for itching and complaints of shortness of breath at work.  Coworker says that about 30 minutes to get to the urgent care.  Patient apparently passed out while walking into [place] to urgent care.  He was transported to trauma room by nursing staff.  I was notified immediately and patient initially not very responsive, only 1 or 2 words and he was keeping his eyes closed.  He was diaphoretic and complaining of chest tightness and trouble breathing.  He was also making abnormal movements with his mouth and tongue and appeared to have some weakness of his left side.  Rash noticed on bilateral forearms.   BP initially 65/52 and pulse 87.  Respirations increased to 22.  Oxygen 90%.  Initial DDx does include acute MI, stroke, and anaphylactic reaction  The rash began to spread within  a couple of minutes to his face and throughout the rest of his upper body and arms.  With his vital signs being so abnormal on the rash worsening, top DDx was anaphylactic reaction.  Patient given epi within 5 to 7 minutes of arrival to [facility] urgent care.  Within 1 or 2 minutes he started to improve and speak in full sentences and stated that his chest tightness and breathing problem were getting better and he was not having any trouble swallowing or swelling of his tongue.  Patient was also started on IV saline bolus.  EMS arrived within the next couple of minutes.  Famotidine added to IV fluids.  Also started on Benadryl. BS 274. Patient improving and report given to EMS.  Patient leaving and found to [facility] in stable condition.  Final Clinical Impressions(s) / UC Diagnoses   Final diagnoses:  Anaphylaxis, initial encounter  Shortness of breath  Other chest pain  Rash and nonspecific skin eruption  Hypotension, unspecified hypotension type     Discharge Instructions     You have been advised to follow up immediately in the emergency department for concerning signs.symptoms. If you declined EMS transport, please have a family member take you directly to the ED at this time. Do not delay. Based on concerns about condition, if you do not follow up in th e ED, you may risk poor outcomes including worsening of condition, delayed treatment and potentially life threatening issues. If you have declined to go to the ED at this time, you should call your PCP immediately to set up a follow up appointment.  Go to ED for red flag symptoms, including; fevers you cannot reduce with Tylenol/Motrin, severe  headaches, vision changes, numbness/weakness in part of the body, lethargy, confusion, intractable vomiting, severe dehydration, chest pain, breathing difficulty, severe persistent abdominal or pelvic pain, signs of severe infection (increased redness, swelling of an area), feeling faint or passing out,  dizziness, etc. You should especially go to the ED for sudden acute worsening of condition if you do not elect to go at this time.     ED Prescriptions    None     PDMP not reviewed this encounter.   Shirlee Latch, PA-C 11/22/20 1504

## 2020-11-22 NOTE — ED Notes (Signed)
Benadryl 50mg  given IV by EMS

## 2020-11-22 NOTE — ED Notes (Signed)
Patient placed on 4L oxygen via [place]

## 2020-11-22 NOTE — Discharge Instructions (Addendum)

## 2020-11-22 NOTE — ED Triage Notes (Addendum)
Patient found outside the door to urgent care. States he lost his balance and fell. Patient assisted to w/c and brought to room 5. Patient reports a rash all over his body, shortness of breath and chest pain. He is having a hard time keeping his eyes open and states he is having trouble breathing. Brooke, PA to bedside. EMS called by front desk staff.

## 2020-11-22 NOTE — Discharge Instructions (Signed)
Your blood sugar was elevated today at 241 and you should have this rechecked by your primary care doctor in the next 3 to 5 days.  This could be a response to the medications were given today but it could also be that you need to have her diabetes meds adjusted.

## 2020-12-30 DIAGNOSIS — M25552 Pain in left hip: Secondary | ICD-10-CM | POA: Insufficient documentation

## 2021-05-18 ENCOUNTER — Ambulatory Visit (INDEPENDENT_AMBULATORY_CARE_PROVIDER_SITE_OTHER): Payer: 59 | Admitting: Internal Medicine

## 2021-05-18 VITALS — BP 144/104 | HR 91 | Resp 18 | Ht 71.0 in | Wt 231.1 lb

## 2021-05-18 DIAGNOSIS — G4733 Obstructive sleep apnea (adult) (pediatric): Secondary | ICD-10-CM | POA: Diagnosis not present

## 2021-05-18 DIAGNOSIS — Z9989 Dependence on other enabling machines and devices: Secondary | ICD-10-CM

## 2021-05-18 DIAGNOSIS — E669 Obesity, unspecified: Secondary | ICD-10-CM

## 2021-05-18 DIAGNOSIS — Z7189 Other specified counseling: Secondary | ICD-10-CM

## 2021-05-18 DIAGNOSIS — Z0000 Encounter for general adult medical examination without abnormal findings: Secondary | ICD-10-CM | POA: Insufficient documentation

## 2021-05-18 NOTE — Patient Instructions (Signed)

## 2021-05-18 NOTE — Progress Notes (Signed)
[facility] [address] [place], [place] [address]  Pulmonary Sleep Medicine   Office Visit Note  Patient Name: Philip Frye [facility] DOB: June 28, 1961 MRN [id number]    Chief Complaint: Obstructive Sleep Apnea visit  Brief History:  Philip Frye is seen today for annual The patient has a 12 year history of sleep apnea. Patient not using PAP nightly @ 5 cmH2O.  The patient feels better, sleeping with PAP.  The patient reports not feeling rested after from PAP use. Reported sleepiness is  unresolved and the Epworth Sleepiness Score is 11 out of 24. The patient does take naps, but not with CPAP. The patient complains of the following: not feeling as rested as he used to for at least a year  The compliance download shows  compliance with an average use time of 4:32 hours average 64%. The AHI is 0.6  The patient does not complain of limb movements disrupting sleep.  ROS  General: (-) fever, (-) chills, (-) night sweat Nose and Sinuses: (-) nasal stuffiness or itchiness, (-) postnasal drip, (-) nosebleeds, (-) sinus trouble. Mouth and Throat: (-) sore throat, (-) hoarseness. Neck: (-) swollen glands, (-) enlarged thyroid, (-) neck pain. Respiratory: - cough, - shortness of breath, - wheezing. Neurologic: + numbness, + tingling. Psychiatric: + anxiety, + depression   Current Medication: Outpatient Encounter Medications as of 05/18/2021  Medication Sig   rosuvastatin (CRESTOR) 20 MG tablet Take 1 tablet by mouth daily.   EPINEPHrine (EPIPEN 2-PAK) 0.3 mg/0.3 mL IJ SOAJ injection Inject 0.3 mg into the muscle as needed for anaphylaxis.   escitalopram (LEXAPRO) 10 MG tablet Take by mouth.   gabapentin (NEURONTIN) 100 MG capsule Take 300 mg by mouth at bedtime.   liraglutide (VICTOZA) 18 MG/3ML SOPN Inject 1.8 mg into the skin daily.   OZEMPIC, 0.25 OR 0.5 MG/DOSE, 2 MG/1.5ML SOPN Inject 0.5 mg into the skin once a week.   rosuvastatin (CRESTOR) 20 MG tablet Take 20 mg by mouth daily.    TRESIBA FLEXTOUCH 200 UNIT/ML FlexTouch Pen SMARTSIG:0-40 Unit(s) SUB-Q Daily   [DISCONTINUED] metFORMIN (GLUCOPHAGE) 500 MG tablet Take by mouth with breakfast, with lunch, and with evening meal.   No facility-administered encounter medications on file as of 05/18/2021.    Surgical History: Past Surgical History:  Procedure Laterality Date   ELBOW SURGERY Bilateral    FINGER SURGERY Left    index    Medical History: Past Medical History:  Diagnosis Date   Diabetes (HCC)    Hyperlipidemia     Family History: Non contributory to the present illness  Social History: Social History   Socioeconomic History   Marital status: Married    Spouse name: Not on file   Number of children: Not on file   Years of education: Not on file   Highest education level: Not on file  Occupational History   Not on file  Tobacco Use   Smoking status: Never   Smokeless tobacco: Never  Vaping Use   Vaping Use: Never used  Substance and Sexual Activity   Alcohol use: No   Drug use: No   Sexual activity: Not on file  Other Topics Concern   Not on file  Social History Narrative   Not on file   Social Determinants of Health   Financial Resource Strain: Not on file  Food Insecurity: Not on file  Transportation Needs: Not on file  Physical Activity: Not on file  Stress: Not on file  Social Connections: Not on  file  Intimate Partner Violence: Not on file    Vital Signs: Blood pressure (!) 144/104, pulse 91, resp. rate 18, height 5\' 11"  (1.803 m), weight 231 lb 1.6 oz (104.8 kg), SpO2 99 %.  Examination: General Appearance: The patient is well-developed, well-nourished, and in no distress. Neck Circumference: 46 cm Skin: Gross inspection of skin unremarkable. Head: normocephalic, no gross deformities. Eyes: no gross deformities noted. ENT: ears appear grossly normal Neurologic: Alert and oriented. No involuntary movements.    EPWORTH SLEEPINESS SCALE:  Scale:  (0)= no  chance of dozing; (1)= slight chance of dozing; (2)= moderate chance of dozing; (3)= high chance of dozing  Chance  Situtation    Sitting and reading: 2    Watching TV: 2    Sitting Inactive in public: 1    As a passenger in car: 1      Lying down to rest: 2    Sitting and talking: 1    Sitting quielty after lunch: 2    In a car, stopped in traffic: 0   TOTAL SCORE:   11 out of 24    SLEEP STUDIES:  PSG 06/17/2008  -  AHI 24,  low SpO2 84%   CPAP COMPLIANCE DATA:  Date Range: 05/18/20 - 05/17/21  Average Daily Use: 4:32 hours  Median Use: 6:02  Compliance for > 4 Hours: 64%  AHI: 0.6 respiratory events per hour  Days Used: 284/365  Mask Leak: 10.6 lpm  95th Percentile Pressure: 5 cmH2O         LABS: No results found for this or any previous visit (from the past 2160 hour(s)).  Radiology: No results found.  No results found.  No results found.    Assessment and Plan: Patient Active Problem List   Diagnosis Date Noted   CPAP use counseling 05/18/2021   Obesity (BMI 30-39.9) 05/18/2021   OSA on CPAP 05/05/2020   Overweight (BMI 25.0-29.9) 05/05/2020   1. OSA on CPAP The patient does tolerate PAP and reports definite benefit from PAP use. The patient was reminded how to clean equipment and not to use SOclean and advised to replace supplies routinely. His machine has reached end of usable life and must be replaced.. The patient was also counselled on weight loss. The compliance is fair. The AHI is 0.6.   OSA- improve time of use. Replace machine. F/u30d after set up.    2. CPAP use counseling CPAP Counseling: had a lengthy discussion with the patient regarding the importance of PAP therapy in management of the sleep apnea. Patient appears to understand the risk factor reduction and also understands the risks associated with untreated sleep apnea. Patient will try to make a good faith effort to remain compliant with therapy. Also instructed  the patient on proper cleaning of the device including the water must be changed daily if possible and use of distilled water is preferred. Patient understands that the machine should be regularly cleaned with appropriate recommended cleaning solutions that do not damage the PAP machine for example given white vinegar and water rinses. Other methods such as ozone treatment may not be as good as these simple methods to achieve cleaning.   3. Obesity (BMI 30-39.9) Obesity Counseling: Had a lengthy discussion regarding patients BMI and weight issues. Patient was instructed on portion control as well as increased activity. Also discussed caloric restrictions with trying to maintain intake less than 2000 Kcal. Discussions were made in accordance with the 5As of weight management. Simple actions  such as not eating late and if able to, taking a walk is suggested.     General Counseling: I have discussed the findings of the evaluation and examination with Jamaal.  I have also discussed any further diagnostic evaluation thatmay be needed or ordered today. Izick verbalizes understanding of the findings of todays visit. We also reviewed his medications today and discussed drug interactions and side effects including but not limited excessive drowsiness and altered mental states. We also discussed that there is always a risk not just to him but also people around him. he has been encouraged to call the office with any questions or concerns that should arise related to todays visit.  No orders of the defined types were placed in this encounter.       I have personally obtained a history, examined the patient, evaluated laboratory and imaging results, formulated the assessment and plan and placed orders.   This patient was seen today by Emmaline Kluver, PA-C in collaboration with Dr. Freda Munro.    Yevonne Pax, MD [facility] Diplomate ABMS Pulmonary and Critical Care Medicine Sleep medicine

## 2021-06-23 ENCOUNTER — Other Ambulatory Visit: Payer: Self-pay

## 2021-06-23 ENCOUNTER — Emergency Department
Admission: EM | Admit: 2021-06-23 | Discharge: 2021-06-23 | Disposition: A | Discharge status: Home / Self Care | Payer: 59 | Attending: Emergency Medicine | Admitting: Emergency Medicine

## 2021-06-23 ENCOUNTER — Emergency Department: Payer: 59

## 2021-06-23 DIAGNOSIS — E119 Type 2 diabetes mellitus without complications: Secondary | ICD-10-CM | POA: Diagnosis not present

## 2021-06-23 DIAGNOSIS — Z79899 Other long term (current) drug therapy: Secondary | ICD-10-CM | POA: Diagnosis not present

## 2021-06-23 DIAGNOSIS — M25571 Pain in right ankle and joints of right foot: Secondary | ICD-10-CM | POA: Insufficient documentation

## 2021-06-23 DIAGNOSIS — Z794 Long term (current) use of insulin: Secondary | ICD-10-CM | POA: Diagnosis not present

## 2021-06-23 DIAGNOSIS — M7660 Achilles tendinitis, unspecified leg: Secondary | ICD-10-CM

## 2021-06-23 MED ORDER — ONDANSETRON 4 MG PO TBDP
4.0000 mg | ORAL_TABLET | Freq: Once | ORAL | Status: AC
  Administered 2021-06-23: 4 mg via ORAL
  Filled 2021-06-23: qty 1

## 2021-06-23 MED ORDER — OXYCODONE-ACETAMINOPHEN 5-325 MG PO TABS
1.0000 | ORAL_TABLET | Freq: Once | ORAL | Status: AC
Start: 2021-06-23 — End: 2021-06-23
  Administered 2021-06-23: 1 via ORAL
  Filled 2021-06-23: qty 1

## 2021-06-23 MED ORDER — ONDANSETRON 4 MG PO TBDP: 4.0000 mg | ORAL_TABLET | Freq: Three times a day (TID) | ORAL | 0 refills | Status: AC | PRN

## 2021-06-23 MED ORDER — OXYCODONE-ACETAMINOPHEN 5-325 MG PO TABS: 1.0000 | ORAL_TABLET | Freq: Four times a day (QID) | ORAL | 0 refills | Status: AC | PRN

## 2021-06-23 NOTE — ED Triage Notes (Signed)
Pt has right leg pain.  Pt states his right leg gave away   pt has pain in right lower leg and above the heel.  No foot pain.  Pt fell when leg gave away.  Pt alert.  Brace on right lower leg.

## 2021-06-23 NOTE — ED Provider Notes (Signed)
Emergency Medicine Provider Triage Evaluation Note  Philip Frye [facility] , a 60 y.o. male  was evaluated in triage.  Pt complains of presents to the ED with acute right ankle pain, after a mechanical injury.  Patient presents from work via EMS, after he reports stepping and pivoting, and had an immediate pain to the high calf and ankle region.  He recently been seen and evaluated for an partial Achilles tear and week prior.  Review of Systems  Positive: Right ankle/achilles pain Negative: Knee pain  Physical Exam  There were no vitals taken for this visit. Gen:   Awake, no distress  NAD Resp:  Normal effort CTA MSK:   Moves extremities without difficulty Tender to the medial righ ankle Other:  CVS: RRR. Normal distal pulses  Medical Decision Making  Medically screening exam initiated at 2:57 PM.  Appropriate orders placed.  Philip Frye was informed that the remainder of the evaluation will be completed by another provider, this initial triage assessment does not replace that evaluation, and the importance of remaining in the ED until their evaluation is complete.  Patient with ED evaluation of acute right ankle/achilles pain s/p W/C injury.   Lissa Hoard, PA-C 06/23/21 1500    Georga Hacking, MD 06/23/21 [phone]

## 2021-06-23 NOTE — Discharge Instructions (Addendum)
You have been prescribed Percocet for pain. Please remember that Percocet is constipating and you need to start a stool softener. Please make follow-up appointment with Dr. Joice Lofts.  Referral information has been included in this paperwork. You can take boot off with showering and sleeping.

## 2021-06-23 NOTE — ED Provider Notes (Signed)
[facility]-EMERGENCY DEPARTMENT  ____________________________________________  Time seen: Approximately 4:03 PM  I have reviewed the triage vital signs and the nursing notes.   HISTORY  Chief Complaint Leg Pain   Historian Patient    HPI Philip Frye is a 60 y.o. male presents to the emergency department with right posterior ankle pain.  Patient reports that he was diagnosed recently with a partial Achilles tendon tear and has been doing strength exercises.  He states that today he was ambulating at work and felt/heard a loud pop and intense pain.  He has had posterior ankle swelling since injury occurred.  No prior surgeries for Achilles tendon repair.  No abrasions or lacerations.   Past Medical History:  Diagnosis Date   Diabetes (HCC)    Hyperlipidemia      Immunizations up to date:  Yes.     Past Medical History:  Diagnosis Date   Diabetes (HCC)    Hyperlipidemia     Patient Active Problem List   Diagnosis Date Noted   CPAP use counseling 05/18/2021   Obesity (BMI 30-39.9) 05/18/2021   OSA on CPAP 05/05/2020   Overweight (BMI 25.0-29.9) 05/05/2020    Past Surgical History:  Procedure Laterality Date   ELBOW SURGERY Bilateral    FINGER SURGERY Left    index    Prior to Admission medications   Medication Sig Start Date End Date Taking? Authorizing Provider  ondansetron (ZOFRAN ODT) 4 MG disintegrating tablet Take 1 tablet (4 mg total) by mouth every 8 (eight) hours as needed for up to 5 days. 06/23/21 06/28/21 Yes Pia Mau M, PA-C  oxyCODONE-acetaminophen (PERCOCET/ROXICET) 5-325 MG tablet Take 1 tablet by mouth every 6 (six) hours as needed for up to 5 days. 06/23/21 06/28/21 Yes Pia Mau M, PA-C  EPINEPHrine (EPIPEN 2-PAK) 0.3 mg/0.3 mL IJ SOAJ injection Inject 0.3 mg into the muscle as needed for anaphylaxis. 11/22/20   Gilles Chiquito, MD  escitalopram (LEXAPRO) 10 MG tablet Take by mouth. 04/28/21   [provider]  gabapentin  (NEURONTIN) 100 MG capsule Take 300 mg by mouth at bedtime. 04/28/21   [provider]  liraglutide (VICTOZA) 18 MG/3ML SOPN Inject 1.8 mg into the skin daily.    [provider]  OZEMPIC, 0.25 OR 0.5 MG/DOSE, 2 MG/1.5ML SOPN Inject 0.5 mg into the skin once a week. 05/04/21   [provider]  rosuvastatin (CRESTOR) 20 MG tablet Take 20 mg by mouth daily.    [provider]  rosuvastatin (CRESTOR) 20 MG tablet Take 1 tablet by mouth daily. 04/28/20   [provider]  TRESIBA FLEXTOUCH 200 UNIT/ML FlexTouch Pen SMARTSIG:0-40 Unit(s) SUB-Q Daily 04/23/21   [provider]    Allergies Metformin  Family History  Problem Relation Age of Onset   Heart disease Mother    Parkinson's disease Mother    Dementia Mother    Heart disease Father    Kidney failure Father     Social History Social History   Tobacco Use   Smoking status: Never   Smokeless tobacco: Never  Vaping Use   Vaping Use: Never used  Substance Use Topics   Alcohol use: No   Drug use: No     Review of Systems  Constitutional: No fever/chills Eyes:  No discharge ENT: No upper respiratory complaints. Respiratory: no cough. No SOB/ use of accessory muscles to breath Gastrointestinal:   No nausea, no vomiting.  No diarrhea.  No constipation. Musculoskeletal: Patient has achilles tendon pain.  Skin: Negative for rash, abrasions, lacerations, ecchymosis.    ____________________________________________   PHYSICAL EXAM:  VITAL SIGNS: ED Triage Vitals  Enc Vitals Group     BP 06/23/21 1535 (!) 158/71     Pulse Rate 06/23/21 1535 (!) 50     Resp 06/23/21 1533 18     Temp 06/23/21 1533 98.3 F (36.8 C)     Temp Source 06/23/21 1533 Oral     SpO2 06/23/21 1535 95 %     Weight 06/23/21 1538 225 lb (102.1 kg)     Height 06/23/21 1538 5\' 11"  (1.803 m)     Head Circumference --      Peak Flow --      Pain Score 06/23/21 1538 7     Pain Loc --      Pain Edu? --       Excl. in GC? --      Constitutional: Alert and oriented. Well appearing and in no acute distress. Eyes: Conjunctivae are normal. PERRL. EOMI. Head: Atraumatic. ENT: Cardiovascular: Normal rate, regular rhythm. Normal S1 and S2.  Good peripheral circulation. Respiratory: Normal respiratory effort without tachypnea or retractions. Lungs CTAB. Good air entry to the bases with no decreased or absent breath sounds Gastrointestinal: Bowel sounds x 4 quadrants. Soft and nontender to palpation. No guarding or rigidity. No distention. Musculoskeletal: Patient performs limited range of motion at the right ankle due to pain.  Patient has tenderness to palpation over the insertion for the right Achilles tendon.  There is a palpable deficit where her Achilles tendon would insert.  Palpable dorsalis pedis pulse, right.  Capillary refill less than 2 seconds on the right. Neurologic:  Normal for age. No gross focal neurologic deficits are appreciated.  Skin:  Skin is warm, dry and intact. No rash noted. Psychiatric: Mood and affect are normal for age. Speech and behavior are normal.   ____________________________________________   LABS (all labs ordered are listed, but only abnormal results are displayed)  Labs Reviewed - No data to display ____________________________________________  EKG   ____________________________________________  RADIOLOGY 06/25/21, personally viewed and evaluated these images (plain radiographs) as part of my medical decision making, as well as reviewing the written report by the radiologist.  DG Ankle Complete Right  Result Date: 06/23/2021 CLINICAL DATA:  Acute right ankle pain EXAM: RIGHT ANKLE - COMPLETE 3+ VIEW COMPARISON:  None. FINDINGS: The ankle joint itself appears normal. There is swelling in the region of the Achilles tendon that could go along with tendinitis or Achilles tendon tear. Insignificant small plantar calcaneal spur. IMPRESSION: No  acute bone finding. Abnormal soft tissue swelling in the Achilles region that could go along with tendinitis or an Achilles tendon tear. Electronically Signed   By: 06/25/2021 M.D.   On: 06/23/2021 15:44    ____________________________________________    PROCEDURES  Procedure(s) performed:     Procedures     Medications  oxyCODONE-acetaminophen (PERCOCET/ROXICET) 5-325 MG per tablet 1 tablet (has no administration in time range)  ondansetron (ZOFRAN-ODT) disintegrating tablet 4 mg (has no administration in time range)     ____________________________________________   INITIAL IMPRESSION / ASSESSMENT AND PLAN / ED COURSE  Pertinent labs & imaging results that were available during my care of the patient were reviewed by me and considered in my medical decision making (see chart for details).    Assessment and plan Achilles tendon pain 60 year old male presents to the emergency department with right posterior ankle pain.  Patient was hypertensive at triage but vital signs were otherwise reassuring.  Patient had no bony abnormality on x-ray but did have soft tissue changes that were concerning for Achilles tendon rupture.  X-ray findings in combination with physical tenderness and palpable deficit over insertion for Achilles tendon suggest Achilles tendon rupture.  Patient was placed in a cam boot and crutches were provided.  He was prescribed Percocet and advised to start a stool softener.  He is advised to follow-up with orthopedics, Dr. Joice Lofts.  Return precautions were given to return with new or worsening symptoms.      ____________________________________________  FINAL CLINICAL IMPRESSION(S) / ED DIAGNOSES  Final diagnoses:  Achilles tendon pain      NEW MEDICATIONS STARTED DURING THIS VISIT:  ED Discharge Orders          Ordered    oxyCODONE-acetaminophen (PERCOCET/ROXICET) 5-325 MG tablet  Every 6 hours PRN        06/23/21 1558    ondansetron (ZOFRAN  ODT) 4 MG disintegrating tablet  Every 8 hours PRN        06/23/21 1558                This chart was dictated using voice recognition software/Dragon. Despite best efforts to proofread, errors can occur which can change the meaning. Any change was purely unintentional.     Orvil Feil, PA-C 06/23/21 1609    Minna Antis, MD 06/23/21 2310

## 2021-07-13 DIAGNOSIS — M7730 Calcaneal spur, unspecified foot: Secondary | ICD-10-CM | POA: Insufficient documentation

## 2021-07-13 DIAGNOSIS — I872 Venous insufficiency (chronic) (peripheral): Secondary | ICD-10-CM | POA: Insufficient documentation

## 2021-07-13 DIAGNOSIS — S81802A Unspecified open wound, left lower leg, initial encounter: Secondary | ICD-10-CM | POA: Insufficient documentation

## 2021-07-13 DIAGNOSIS — M7661 Achilles tendinitis, right leg: Secondary | ICD-10-CM | POA: Insufficient documentation

## 2021-07-13 DIAGNOSIS — S86011A Strain of right Achilles tendon, initial encounter: Secondary | ICD-10-CM | POA: Insufficient documentation

## 2021-10-13 DIAGNOSIS — R269 Unspecified abnormalities of gait and mobility: Secondary | ICD-10-CM | POA: Insufficient documentation

## 2021-10-13 DIAGNOSIS — M6281 Muscle weakness (generalized): Secondary | ICD-10-CM | POA: Insufficient documentation

## 2022-05-15 ENCOUNTER — Ambulatory Visit
Admission: EM | Admit: 2022-05-15 | Discharge: 2022-05-15 | Disposition: A | Discharge status: Home / Self Care | Payer: Worker's Compensation | Attending: Emergency Medicine | Admitting: Emergency Medicine

## 2022-05-15 ENCOUNTER — Ambulatory Visit (INDEPENDENT_AMBULATORY_CARE_PROVIDER_SITE_OTHER): Payer: 59

## 2022-05-15 DIAGNOSIS — S6000XA Contusion of unspecified finger without damage to nail, initial encounter: Secondary | ICD-10-CM | POA: Diagnosis not present

## 2022-05-15 DIAGNOSIS — M79645 Pain in left finger(s): Secondary | ICD-10-CM | POA: Diagnosis not present

## 2022-05-15 DIAGNOSIS — S60222A Contusion of left hand, initial encounter: Secondary | ICD-10-CM | POA: Diagnosis not present

## 2022-05-15 NOTE — ED Provider Notes (Signed)
[facility]    CSN: [id number] Arrival date & time: 05/15/22  1146      History   Chief Complaint Chief Complaint  Patient presents with   workers comp   Hand Injury    Left     HPI Philip Frye is a 61 y.o. male.   HPI  61 year old male here for evaluation of left hand injury.  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  He states that he had remove the housing from the belt after turning off the machine and was walking the belts through the rollers to look for breaks when the operator turn the machine back on and it pulled his hand into the machine.  He states that his hand got pulled into a recess but did not get pulled into any rollers or gears.  He was able to pull his hand free.  He has some small skin tears on his left middle finger at the cuticle, middle phalanx, proximal phalanx.  He is complaining of pain and swelling in the proximal phalanx of the left middle finger as well as the distal hand near the index and middle finger.  He did put ice on his hand but states that the ice was causing a burning sensation.  He has decreased range of motion secondary to pain and swelling as well.  He has full sensation at the tip of his finger however.  Past Medical History:  Diagnosis Date   Diabetes (HCC)    Hyperlipidemia     Patient Active Problem List   Diagnosis Date Noted   CPAP use counseling 05/18/2021   Obesity (BMI 30-39.9) 05/18/2021   OSA on CPAP 05/05/2020   Overweight (BMI 25.0-29.9) 05/05/2020    Past Surgical History:  Procedure Laterality Date   ELBOW SURGERY Bilateral    FINGER SURGERY Left    index       Home Medications    Prior to Admission medications   Medication Sig Start Date End Date Taking? Authorizing Provider  EPINEPHrine (EPIPEN 2-PAK) 0.3 mg/0.3 mL IJ SOAJ injection Inject 0.3 mg into the muscle as needed for  anaphylaxis. 11/22/20   Gilles Chiquito, MD  escitalopram (LEXAPRO) 10 MG tablet Take by mouth. 04/28/21   [provider]  gabapentin (NEURONTIN) 100 MG capsule Take 300 mg by mouth at bedtime. 04/28/21   [provider]  liraglutide (VICTOZA) 18 MG/3ML SOPN Inject 1.8 mg into the skin daily.    [provider]  OZEMPIC, 0.25 OR 0.5 MG/DOSE, 2 MG/1.5ML SOPN Inject 0.5 mg into the skin once a week. 05/04/21   [provider]  rosuvastatin (CRESTOR) 20 MG tablet Take 20 mg by mouth daily.    [provider]  rosuvastatin (CRESTOR) 20 MG tablet Take 1 tablet by mouth daily. 04/28/20   [provider]  TRESIBA FLEXTOUCH 200 UNIT/ML FlexTouch Pen SMARTSIG:0-40 Unit(s) SUB-Q Daily 04/23/21   [provider]    Family History Family History  Problem Relation Age of Onset   Heart disease Mother    Parkinson's disease Mother    Dementia Mother    Heart disease Father    Kidney failure Father     Social History Social History   Tobacco Use   Smoking status: Never   Smokeless tobacco: Never  Vaping Use   Vaping Use: Never used  Substance Use Topics   Alcohol use:  No   Drug use: No     Allergies   Metformin   Review of Systems Review of Systems  Musculoskeletal:  Positive for arthralgias and joint swelling.  Skin:  Positive for color change and wound.  Neurological:  Negative for weakness and numbness.  Hematological: Negative.   Psychiatric/Behavioral: Negative.       Physical Exam Triage Vital Signs ED Triage Vitals [05/15/22 1203]  Enc Vitals Group     BP      Pulse      Resp      Temp      Temp src      SpO2      Weight 221 lb (100.2 kg)     Height 5\' 11"  (1.803 m)     Head Circumference      Peak Flow      Pain Score 8     Pain Loc      Pain Edu?      Excl. in [place]?    No data found.  Updated Vital Signs BP 124/85 (BP Location: Left Arm)   Pulse 91   Temp 98.2 F (36.8 C) (Oral)   Ht 5\' 11"   (1.803 m)   Wt 221 lb (100.2 kg)   SpO2 98%   BMI 30.82 kg/m   Visual Acuity Right Eye Distance:   Left Eye Distance:   Bilateral Distance:    Right Eye Near:   Left Eye Near:    Bilateral Near:     Physical Exam Vitals and nursing note reviewed.  Constitutional:      Appearance: Normal appearance. He is not ill-appearing.  HENT:     Head: Normocephalic and atraumatic.  Musculoskeletal:        General: Swelling, tenderness and signs of injury present. No deformity.  Skin:    General: Skin is warm and dry.     Capillary Refill: Capillary refill takes less than 2 seconds.     Findings: Erythema present.  Neurological:     General: No focal deficit present.     Mental Status: He is alert and oriented to person, place, and time.     Sensory: No sensory deficit.     Motor: No weakness.  Psychiatric:        Mood and Affect: Mood normal.        Behavior: Behavior normal.        Thought Content: Thought content normal.        Judgment: Judgment normal.      UC Treatments / Results  Labs (all labs ordered are listed, but only abnormal results are displayed) Labs Reviewed - No data to display  EKG   Radiology DG Hand Complete Left  Result Date: 05/15/2022 CLINICAL DATA:  Injury, LEFT hand swelling, middle finger laceration. EXAM: LEFT HAND - COMPLETE 3+ VIEW COMPARISON:  None Available. FINDINGS: Osseous alignment is normal. No fracture line or displaced fracture fragment is seen. Vascular atherosclerosis noted at the level of the LEFT wrist. Soft tissues about the LEFT hand are unremarkable. No foreign body is seen within the soft tissues. IMPRESSION: 1. No osseous fracture or dislocation. 2. Vascular atherosclerosis. 3. No foreign body identified within the soft tissues. Electronically Signed   By: M.D.   On: 05/15/2022 12:21    Procedures Procedures (including critical care time)  Medications Ordered in UC Medications - No data to display  Initial  Impression / Assessment and Plan / UC Course  I have  reviewed the triage vital signs and the nursing notes.  Pertinent labs & imaging results that were available during my care of the patient were reviewed by me and considered in my medical decision making (see chart for details).   Patient is a very pleasant, nontoxic-appearing 61 year old male here for evaluation of left hand injury that occurred as result of getting his hand pulled into a cigarette machine at his place of work.  He provides maintenance for the machines.      On exam patient's left hand is in normal anatomical position but there is swelling and erythema to the left middle finger.  There is a small abrasion on the lateral aspect of the middle phalanx just before the DIP joint, small skin tear on the medial cuticle of the left middle finger, and a small scratch on the proximal phalanx on the dorsal aspect of the left middle finger.  There is erythema and edema overlying the MCP joint of the second and third fingers and they are both tender to palpation.  Patient can mostly close his fist but he has decreased range of motion of his middle finger at the MCP joint.  He has good sensation in the tip of his finger and strong resisted flexion and extension of the distal aspect of his left middle finger.  I will order a x-ray of the left hand to rule out bony abnormality.  Left hand x-ray independently reviewed and evaluated by me.  Impression: No evidence of fracture or dislocation.  There is some mild soft tissue swelling.  Radiology overread is pending. Radiology impression states no osseous fracture or dislocation.  Vascular atherosclerosis.  No foreign body identified within the soft tissues.  I will order patient's wounds to be cleaned and dressed with bacitracin and Band-Aids.  I we will diagnose him with a contusion of his left hand and treat him with home physical therapy, ibuprofen, ice, and elevation.  I will also give him a work  note to cover him for the rest of the weekend so he can rest his hand.   Final Clinical Impressions(s) / UC Diagnoses   Final diagnoses:  Contusion of left hand including fingers, initial encounter     Discharge Instructions      Keep the wounds of your left hand clean and dry.  Apply over-the-counter bacitracin ointment twice daily to the wounds for the next 2 days until the scab has formed.  After that you can leave the wounds open to air when you are at home and cover them with a Band-Aid when you are at work.  Apply ice to your hand for 20 minutes at a time 2-3 times a day to help decrease swelling and aid in pain relief.  Keep your left hand elevated is much as possible to help decrease swelling and aid in pain relief as well.  Use over-the-counter Tylenol and ibuprofen according to the package instructions as needed for pain.  Put your hand to range of motion several times a day to help maintain mobility and function.  If you develop any increased pain or swelling either return for reevaluation or seek care in the emergency department.     ED Prescriptions   None    PDMP not reviewed this encounter.   Margarette Canada, NP 05/15/22 1232

## 2022-05-15 NOTE — ED Triage Notes (Addendum)
Patient reports that he was at work and he needed to check some belts on a fan motor when he went to check the belts the operator started the machine and it jerked his left hand.   Patient reports that he has swelling in his left hand and a cut on his middle finger. -- this happened at about 10:30 this AM

## 2022-05-15 NOTE — Discharge Instructions (Addendum)
Keep the wounds of your left hand clean and dry.  Apply over-the-counter bacitracin ointment twice daily to the wounds for the next 2 days until the scab has formed.  After that you can leave the wounds open to air when you are at home and cover them with a Band-Aid when you are at work.  Apply ice to your hand for 20 minutes at a time 2-3 times a day to help decrease swelling and aid in pain relief.  Keep your left hand elevated is much as possible to help decrease swelling and aid in pain relief as well.  Use over-the-counter Tylenol and ibuprofen according to the package instructions as needed for pain.  Put your hand to range of motion several times a day to help maintain mobility and function.  If you develop any increased pain or swelling either return for reevaluation or seek care in the emergency department.

## 2022-10-27 ENCOUNTER — Encounter: Payer: Self-pay | Admitting: Emergency Medicine

## 2022-10-27 ENCOUNTER — Ambulatory Visit
Admission: EM | Admit: 2022-10-27 | Discharge: 2022-10-27 | Disposition: A | Discharge status: Home / Self Care | Payer: 59 | Attending: Emergency Medicine | Admitting: Emergency Medicine

## 2022-10-27 DIAGNOSIS — U071 COVID-19: Secondary | ICD-10-CM | POA: Insufficient documentation

## 2022-10-27 LAB — RESP PANEL BY RT-PCR (RSV, FLU A&B, COVID)  RVPGX2
Influenza A by PCR: NEGATIVE
Influenza B by PCR: NEGATIVE
Resp Syncytial Virus by PCR: NEGATIVE
SARS Coronavirus 2 by RT PCR: POSITIVE — AB

## 2022-10-27 MED ORDER — PROMETHAZINE-DM 6.25-15 MG/5ML PO SYRP: 5.0000 mL | ORAL_SOLUTION | Freq: Four times a day (QID) | ORAL | 0 refills | Status: DC | PRN

## 2022-10-27 MED ORDER — IPRATROPIUM BROMIDE 0.06 % NA SOLN: 2.0000 | Freq: Four times a day (QID) | NASAL | 12 refills | Status: DC

## 2022-10-27 MED ORDER — NIRMATRELVIR/RITONAVIR (PAXLOVID)TABLET: 3.0000 | ORAL_TABLET | Freq: Two times a day (BID) | ORAL | 0 refills | Status: AC

## 2022-10-27 MED ORDER — BENZONATATE 100 MG PO CAPS: 200.0000 mg | ORAL_CAPSULE | Freq: Three times a day (TID) | ORAL | 0 refills | Status: DC

## 2022-10-27 NOTE — ED Provider Notes (Signed)
[facility]    CSN: [id number] Arrival date & time: 10/27/22  0846      History   Chief Complaint Chief Complaint  Patient presents with   Cough   Nasal Congestion    HPI Philip Frye is a 62 y.o. male.   HPI  62 year old male here for evaluation of respiratory complaints.  The patient has a significant past medical history to include obstructive sleep apnea on CPAP, obesity, diabetes, and hyperlipidemia and is presenting for evaluation of 2-day history of fever with a Tmax of 101, ear pain, sore throat, nasal congestion with clear nasal discharge, sinus pressure, and a nonproductive cough.  He denies any shortness of breath or wheezing.  Past Medical History:  Diagnosis Date   Diabetes ([place])    Hyperlipidemia     Patient Active Problem List   Diagnosis Date Noted   CPAP use counseling 05/18/2021   Obesity (BMI 30-39.9) 05/18/2021   OSA on CPAP 05/05/2020   Overweight (BMI 25.0-29.9) 05/05/2020    Past Surgical History:  Procedure Laterality Date   ELBOW SURGERY Bilateral    FINGER SURGERY Left    index       Home Medications    Prior to Admission medications   Medication Sig Start Date End Date Taking? Authorizing Provider  benzonatate (TESSALON) 100 MG capsule Take 2 capsules (200 mg total) by mouth every 8 (eight) hours. 10/27/22  Yes Margarette Canada, NP  escitalopram (LEXAPRO) 10 MG tablet Take by mouth. 04/28/21  Yes [provider]  gabapentin (NEURONTIN) 100 MG capsule Take 300 mg by mouth at bedtime. 04/28/21  Yes [provider]  ipratropium (ATROVENT) 0.06 % nasal spray Place 2 sprays into both nostrils 4 (four) times daily. 10/27/22  Yes Margarette Canada, NP  loratadine (CLARITIN) 10 MG tablet Take 1 tablet by mouth daily. 08/16/22  Yes [provider]  nirmatrelvir/ritonavir (PAXLOVID) 20 x 150 MG & 10 x 100MG TABS Take 3 tablets by mouth 2 (two) times daily for 5 days. 10/27/22 11/01/22 Yes Margarette Canada, NP   OZEMPIC, 0.25 OR 0.5 MG/DOSE, 2 MG/1.5ML SOPN Inject 0.5 mg into the skin once a week. 05/04/21  Yes [provider]  promethazine-dextromethorphan (PROMETHAZINE-DM) 6.25-15 MG/5ML syrup Take 5 mLs by mouth 4 (four) times daily as needed. 10/27/22  Yes Margarette Canada, NP  rosuvastatin (CRESTOR) 20 MG tablet Take 20 mg by mouth daily.   Yes [provider]  TRESIBA FLEXTOUCH 200 UNIT/ML FlexTouch Pen SMARTSIG:0-40 Unit(s) SUB-Q Daily 04/23/21  Yes [provider]  EPINEPHrine (EPIPEN 2-PAK) 0.3 mg/0.3 mL IJ SOAJ injection Inject 0.3 mg into the muscle as needed for anaphylaxis. 11/22/20   Lucrezia Starch, MD  rosuvastatin (CRESTOR) 20 MG tablet Take 1 tablet by mouth daily. 04/28/20   [provider]    Family History Family History  Problem Relation Age of Onset   Heart disease Mother    Parkinson's disease Mother    Dementia Mother    Heart disease Father    Kidney failure Father     Social History Social History   Tobacco Use   Smoking status: Never   Smokeless tobacco: Never  Vaping Use   Vaping Use: Never used  Substance Use Topics   Alcohol use: No   Drug use: No     Allergies   Metformin   Review of Systems Review of Systems  Constitutional:  Positive for fever.  HENT:  Positive for congestion, ear pain, rhinorrhea, sinus  pressure and sore throat.   Respiratory:  Positive for cough. Negative for shortness of breath and wheezing.      Physical Exam Triage Vital Signs ED Triage Vitals  Enc Vitals Group     BP 10/27/22 0924 (!) 139/91     Pulse Rate 10/27/22 0924 94     Resp 10/27/22 0924 16     Temp 10/27/22 0924 97.9 F (36.6 C)     Temp Source 10/27/22 0924 Oral     SpO2 10/27/22 0924 96 %     Weight 10/27/22 0922 220 lb 14.4 oz (100.2 kg)     Height 10/27/22 0922 5' 11"$  (1.803 m)     Head Circumference --      Peak Flow --      Pain Score 10/27/22 0922 0     Pain Loc --      Pain Edu? --      Excl. in [place]? --    No  data found.  Updated Vital Signs BP (!) 139/91 (BP Location: Left Arm)   Pulse 94   Temp 97.9 F (36.6 C) (Oral)   Resp 16   Ht 5' 11"$  (1.803 m)   Wt 220 lb 14.4 oz (100.2 kg)   SpO2 96%   BMI 30.81 kg/m   Visual Acuity Right Eye Distance:   Left Eye Distance:   Bilateral Distance:    Right Eye Near:   Left Eye Near:    Bilateral Near:     Physical Exam Vitals and nursing note reviewed.  Constitutional:      Appearance: Normal appearance. He is not ill-appearing.  HENT:     Head: Normocephalic and atraumatic.     Right Ear: Tympanic membrane, ear canal and external ear normal. There is no impacted cerumen.     Left Ear: Tympanic membrane, ear canal and external ear normal. There is no impacted cerumen.     Nose: Congestion and rhinorrhea present.     Comments: Nasal mucosa is erythematous and edematous with scant clear discharge in both nares.    Mouth/Throat:     Mouth: Mucous membranes are moist.     Pharynx: Oropharynx is clear. No oropharyngeal exudate or posterior oropharyngeal erythema.  Cardiovascular:     Rate and Rhythm: Normal rate and regular rhythm.     Pulses: Normal pulses.     Heart sounds: Normal heart sounds. No murmur heard.    No friction rub. No gallop.  Pulmonary:     Effort: Pulmonary effort is normal.     Breath sounds: Normal breath sounds. No wheezing, rhonchi or rales.  Musculoskeletal:     Cervical back: Normal range of motion.  Lymphadenopathy:     Cervical: No cervical adenopathy.  Skin:    General: Skin is warm and dry.     Capillary Refill: Capillary refill takes less than 2 seconds.     Findings: No rash.  Neurological:     General: No focal deficit present.     Mental Status: He is alert and oriented to person, place, and time.      UC Treatments / Results  Labs (all labs ordered are listed, but only abnormal results are displayed) Labs Reviewed  RESP PANEL BY RT-PCR (RSV, FLU A&B, COVID)  [id number] - Abnormal; Notable for  the following components:      Result Value   SARS Coronavirus 2 by RT PCR POSITIVE (*)    All other components within normal limits  EKG   Radiology No results found.  Procedures Procedures (including critical care time)  Medications Ordered in UC Medications - No data to display  Initial Impression / Assessment and Plan / UC Course  I have reviewed the triage vital signs and the nursing notes.  Pertinent labs & imaging results that were available during my care of the patient were reviewed by me and considered in my medical decision making (see chart for details).   Patient is a pleasant, nontoxic-appearing 62 year old male here for evaluation of 2 days worth of respiratory symptoms as outlined HPI above.  His vital signs are stable with a heart rate 94, respirations 16, rare oxygen saturation is 96%.  He is afebrile at 97 9.  He is able to speak in full sentences without dyspnea or tachypnea.  His physical exam does reveal inflamed nasal mucosa with clear nasal discharge.  The remainder of his upper respiratory tree is benign and his cardiopulmonary exam reveals clear lung sounds in all fields.  I suspect that he has a viral URI and drainage is what is triggering his cough.  I will order a respiratory panel to look for the presence of flu, RSV, or COVID.  Respiratory panel is positive for COVID.  I will discharge patient home with a diagnosis of COVID-19 and start him on Paxlovid.  He has a CMP in epic from [facility] on 10/06/2021 which shows a GFR of >90.  I will also prescribe Atrovent nasal spray, Tessalon Perles, Promethazine DM cough syrup to help with cough and congestion.  Tylenol and or ibuprofen as needed for fever and body aches.  Quarantine precautions reviewed.  ER precautions reviewed.  Work note provided.   Final Clinical Impressions(s) / UC Diagnoses   Final diagnoses:  None     Discharge Instructions      You have tested positive for COVID-19 today.  You will  need to quarantine for 5 days from onset of your symptoms.  After 5 days you can break quarantine if you have not had a fever for 24 hours without taking Tylenol and/or ibuprofen.  You must wear a mask around other people for an additional 5 days however.  Use over-the-counter Tylenol and/or ibuprofen according to the package instructions as needed for body aches and fever.  Take the Paxlovid twice daily for 5 days for treatment of COVID-19.  Use the Atrovent nasal spray, 2 squirts in each nostril every 6 hours, as needed for runny nose and postnasal drip.  Use the Tessalon Perles every 8 hours during the day.  Take them with a small sip of water.  They may give you some numbness to the base of your tongue or a metallic taste in your mouth, this is normal.  Use the Promethazine DM cough syrup at bedtime for cough and congestion.  It will make you drowsy so do not take it during the day.  If you develop any worsening shortness of breath, shortness of breath at rest, feel as though you cannot catch your breath, you are unable to speak in full sentences, or your lips begin turning blue you need to call 911 and go to the ER.     ED Prescriptions     Medication Sig Dispense Auth. Provider   nirmatrelvir/ritonavir (PAXLOVID) 20 x 150 MG & 10 x 100MG TABS Take 3 tablets by mouth 2 (two) times daily for 5 days. 30 tablet Margarette Canada, NP   benzonatate (TESSALON) 100 MG capsule Take 2 capsules (200  mg total) by mouth every 8 (eight) hours. 21 capsule Margarette Canada, NP   ipratropium (ATROVENT) 0.06 % nasal spray Place 2 sprays into both nostrils 4 (four) times daily. 15 mL Margarette Canada, NP   promethazine-dextromethorphan (PROMETHAZINE-DM) 6.25-15 MG/5ML syrup Take 5 mLs by mouth 4 (four) times daily as needed. 118 mL Margarette Canada, NP      PDMP not reviewed this encounter.   Margarette Canada, NP 10/27/22 1018

## 2022-10-27 NOTE — Discharge Instructions (Addendum)
You have tested positive for COVID-19 today.  You will need to quarantine for 5 days from onset of your symptoms.  After 5 days you can break quarantine if you have not had a fever for 24 hours without taking Tylenol and/or ibuprofen.  You must wear a mask around other people for an additional 5 days however.  Use over-the-counter Tylenol and/or ibuprofen according to the package instructions as needed for body aches and fever.  Take the Paxlovid twice daily for 5 days for treatment of COVID-19.  Use the Atrovent nasal spray, 2 squirts in each nostril every 6 hours, as needed for runny nose and postnasal drip.  Use the Tessalon Perles every 8 hours during the day.  Take them with a small sip of water.  They may give you some numbness to the base of your tongue or a metallic taste in your mouth, this is normal.  Use the Promethazine DM cough syrup at bedtime for cough and congestion.  It will make you drowsy so do not take it during the day.  If you develop any worsening shortness of breath, shortness of breath at rest, feel as though you cannot catch your breath, you are unable to speak in full sentences, or your lips begin turning blue you need to call 911 and go to the ER.

## 2022-10-27 NOTE — ED Triage Notes (Signed)
Pt c/o cough, nasal congestion, chills, and fever. Started about 2 days ago.

## 2022-12-16 ENCOUNTER — Other Ambulatory Visit: Payer: Self-pay | Admitting: Physician Assistant

## 2022-12-16 DIAGNOSIS — M5412 Radiculopathy, cervical region: Secondary | ICD-10-CM

## 2022-12-16 DIAGNOSIS — R200 Anesthesia of skin: Secondary | ICD-10-CM

## 2022-12-16 DIAGNOSIS — R531 Weakness: Secondary | ICD-10-CM

## 2023-01-20 DIAGNOSIS — M7732 Calcaneal spur, left foot: Secondary | ICD-10-CM | POA: Insufficient documentation

## 2023-02-10 DIAGNOSIS — M62172 Other rupture of muscle (nontraumatic), left ankle and foot: Secondary | ICD-10-CM | POA: Insufficient documentation

## 2023-02-24 DIAGNOSIS — M779 Enthesopathy, unspecified: Secondary | ICD-10-CM | POA: Insufficient documentation

## 2023-06-01 ENCOUNTER — Ambulatory Visit
Admission: EM | Admit: 2023-06-01 | Discharge: 2023-06-01 | Disposition: A | Discharge status: Home / Self Care | Payer: 59 | Attending: Emergency Medicine | Admitting: Emergency Medicine

## 2023-06-01 ENCOUNTER — Encounter: Payer: Self-pay | Admitting: Emergency Medicine

## 2023-06-01 DIAGNOSIS — J069 Acute upper respiratory infection, unspecified: Secondary | ICD-10-CM

## 2023-06-01 MED ORDER — IPRATROPIUM BROMIDE 0.06 % NA SOLN: 2.0000 | Freq: Four times a day (QID) | NASAL | 12 refills | Status: AC

## 2023-06-01 MED ORDER — PROMETHAZINE-DM 6.25-15 MG/5ML PO SYRP: 5.0000 mL | ORAL_SOLUTION | Freq: Four times a day (QID) | ORAL | 0 refills | Status: AC | PRN

## 2023-06-01 MED ORDER — BENZONATATE 100 MG PO CAPS: 200.0000 mg | ORAL_CAPSULE | Freq: Three times a day (TID) | ORAL | 0 refills | Status: AC

## 2023-06-01 NOTE — ED Triage Notes (Signed)
Pt presents with sinus pressure, cough and chest congestion x 4 days. Pt has tried OTC cough and cold medication with no relief.

## 2023-06-01 NOTE — ED Provider Notes (Signed)
[facility]    CSN: [id number] Arrival date & time: 06/01/23  4098      History   Chief Complaint Chief Complaint  Patient presents with   sinus pressure    chest congestion    Cough    HPI Durl Walthall is a 62 y.o. male.   HPI  62 year old male with a past medical history significant for hyperlipidemia, diabetes, obesity, and OSA on CPAP presents for evaluation of 4 days with respiratory symptoms which consist of runny nose, nasal congestion, sinus pressure, ear pain, sore throat, and nonproductive cough.  On the first day of symptoms he had diarrhea but that has resolved.  No fever, shortness of breath, wheezing, nausea, or vomiting.  His wife has similar symptoms and was recently treated by her PCP for "walking pneumonia".  Past Medical History:  Diagnosis Date   Diabetes (HCC)    Hyperlipidemia     Patient Active Problem List   Diagnosis Date Noted   CPAP use counseling 05/18/2021   Obesity (BMI 30-39.9) 05/18/2021   OSA on CPAP 05/05/2020   Overweight (BMI 25.0-29.9) 05/05/2020    Past Surgical History:  Procedure Laterality Date   ELBOW SURGERY Bilateral    FINGER SURGERY Left    index       Home Medications    Prior to Admission medications   Medication Sig Start Date End Date Taking? Authorizing Provider  benzonatate (TESSALON) 100 MG capsule Take 2 capsules (200 mg total) by mouth every 8 (eight) hours. 06/01/23  Yes Becky Augusta, NP  ipratropium (ATROVENT) 0.06 % nasal spray Place 2 sprays into both nostrils 4 (four) times daily. 06/01/23  Yes Becky Augusta, NP  promethazine-dextromethorphan (PROMETHAZINE-DM) 6.25-15 MG/5ML syrup Take 5 mLs by mouth 4 (four) times daily as needed. 06/01/23  Yes Becky Augusta, NP  EPINEPHrine (EPIPEN 2-PAK) 0.3 mg/0.3 mL IJ SOAJ injection Inject 0.3 mg into the muscle as needed for anaphylaxis. 11/22/20   Gilles Chiquito, MD  escitalopram (LEXAPRO) 10 MG tablet Take by mouth. 04/28/21   [provider]  gabapentin (NEURONTIN) 100 MG capsule Take 300 mg by mouth at bedtime. 04/28/21   [provider]  loratadine (CLARITIN) 10 MG tablet Take 1 tablet by mouth daily. 08/16/22   [provider]  OZEMPIC, 0.25 OR 0.5 MG/DOSE, 2 MG/1.5ML SOPN Inject 0.5 mg into the skin once a week. 05/04/21   [provider]  rosuvastatin (CRESTOR) 20 MG tablet Take 20 mg by mouth daily.    [provider]  rosuvastatin (CRESTOR) 20 MG tablet Take 1 tablet by mouth daily. 04/28/20   [provider]  TRESIBA FLEXTOUCH 200 UNIT/ML FlexTouch Pen SMARTSIG:0-40 Unit(s) SUB-Q Daily 04/23/21   [provider]    Family History Family History  Problem Relation Age of Onset   Heart disease Mother    Parkinson's disease Mother    Dementia Mother    Heart disease Father    Kidney failure Father     Social History Social History   Tobacco Use   Smoking status: Never   Smokeless tobacco: Never  Vaping Use   Vaping status: Never Used  Substance Use Topics   Alcohol use: No   Drug use: No     Allergies   Metformin   Review of Systems Review of Systems  Constitutional:  Negative for fever.  HENT:  Positive for congestion, ear pain, rhinorrhea, sinus pressure and sore throat.   Respiratory:  Positive for cough.  Negative for shortness of breath and wheezing.   Gastrointestinal:  Positive for diarrhea. Negative for nausea and vomiting.     Physical Exam Triage Vital Signs ED Triage Vitals  Encounter Vitals Group     BP 06/01/23 1013 (!) 120/97     Systolic BP Percentile --      Diastolic BP Percentile --      Pulse Rate 06/01/23 1013 94     Resp 06/01/23 1013 16     Temp 06/01/23 1013 98.3 F (36.8 C)     Temp Source 06/01/23 1013 Oral     SpO2 06/01/23 1013 99 %     Weight --      Height --      Head Circumference --      Peak Flow --      Pain Score 06/01/23 1012 5     Pain Loc --      Pain Education --      Exclude from  Growth Chart --    No data found.  Updated Vital Signs BP (!) 120/97 (BP Location: Left Arm)   Pulse 94   Temp 98.3 F (36.8 C) (Oral)   Resp 16   SpO2 99%   Visual Acuity Right Eye Distance:   Left Eye Distance:   Bilateral Distance:    Right Eye Near:   Left Eye Near:    Bilateral Near:     Physical Exam Vitals and nursing note reviewed.  Constitutional:      Appearance: Normal appearance. He is not ill-appearing.  HENT:     Head: Normocephalic and atraumatic.     Right Ear: Tympanic membrane, ear canal and external ear normal. There is no impacted cerumen.     Left Ear: Tympanic membrane, ear canal and external ear normal. There is no impacted cerumen.     Nose: Congestion present. No rhinorrhea.     Comments: Nasal mucosa is erythematous and mildly edematous without appreciable discharge.    Mouth/Throat:     Mouth: Mucous membranes are moist.     Pharynx: Oropharynx is clear. Posterior oropharyngeal erythema present. No oropharyngeal exudate.     Comments: Mild erythema to the posterior oropharynx with clear postnasal drip. Cardiovascular:     Rate and Rhythm: Normal rate and regular rhythm.     Pulses: Normal pulses.     Heart sounds: No murmur heard.    No friction rub. No gallop.  Pulmonary:     Effort: Pulmonary effort is normal.     Breath sounds: Normal breath sounds. No wheezing, rhonchi or rales.  Musculoskeletal:     Cervical back: Normal range of motion and neck supple.  Lymphadenopathy:     Cervical: No cervical adenopathy.  Skin:    General: Skin is warm and dry.     Capillary Refill: Capillary refill takes less than 2 seconds.     Findings: No erythema or rash.  Neurological:     General: No focal deficit present.     Mental Status: He is alert and oriented to person, place, and time.      UC Treatments / Results  Labs (all labs ordered are listed, but only abnormal results are displayed) Labs Reviewed - No data to  display  EKG   Radiology No results found.  Procedures Procedures (including critical care time)  Medications Ordered in UC Medications - No data to display  Initial Impression / Assessment and Plan / UC Course  I have reviewed the  triage vital signs and the nursing notes.  Pertinent labs & imaging results that were available during my care of the patient were reviewed by me and considered in my medical decision making (see chart for details).   Patient is a pleasant, nontoxic-appearing 62 year old male presenting for evaluation of 4 days with respiratory symptoms as outlined HPI above.  The patient is not in any acute distress at all.  He is able to speak in full sentence without dyspnea or tachypnea.  Respiratory rate at triage was 16 with a room air oxygen saturation of 99%.  I feel that the patient has a viral respiratory infection, possibly COVID.  Given that his symptoms are mild and he is on day 4 I do not feel necessary to be tested for COVID at this time.  I will treat him for viral URI with cough with Atrovent nasal spray, Tessalon Perles, Promethazine DM cough syrup.  Return precautions and ER precautions reviewed.   Final Clinical Impressions(s) / UC Diagnoses   Final diagnoses:  Viral URI with cough     Discharge Instructions      You have been diagnosed with a viral respiratory infection.  I would recommend that you wear a mask when you are around other people for your first 5 days of symptoms.  Use over-the-counter Tylenol and/or ibuprofen according to the package instructions as needed for fever or pain.  Use the Atrovent nasal spray, 2 squirts in each nostril every 6 hours, as needed for runny nose and postnasal drip.  Use the Tessalon Perles every 8 hours during the day.  Take them with a small sip of water.  They may give you some numbness to the base of your tongue or a metallic taste in your mouth, this is normal.  Use the Promethazine DM cough syrup at  bedtime for cough and congestion.  It will make you drowsy so do not take it during the day.  Return for reevaluation or see your primary care provider for any new or worsening symptoms.      ED Prescriptions     Medication Sig Dispense Auth. Provider   benzonatate (TESSALON) 100 MG capsule Take 2 capsules (200 mg total) by mouth every 8 (eight) hours. 21 capsule Becky Augusta, NP   ipratropium (ATROVENT) 0.06 % nasal spray Place 2 sprays into both nostrils 4 (four) times daily. 15 mL Becky Augusta, NP   promethazine-dextromethorphan (PROMETHAZINE-DM) 6.25-15 MG/5ML syrup Take 5 mLs by mouth 4 (four) times daily as needed. 118 mL Becky Augusta, NP      PDMP not reviewed this encounter.   Becky Augusta, NP 06/01/23 1043

## 2023-06-01 NOTE — Discharge Instructions (Addendum)
You have been diagnosed with a viral respiratory infection.  I would recommend that you wear a mask when you are around other people for your first 5 days of symptoms.  Use over-the-counter Tylenol and/or ibuprofen according to the package instructions as needed for fever or pain.  Use the Atrovent nasal spray, 2 squirts in each nostril every 6 hours, as needed for runny nose and postnasal drip.  Use the Tessalon Perles every 8 hours during the day.  Take them with a small sip of water.  They may give you some numbness to the base of your tongue or a metallic taste in your mouth, this is normal.  Use the Promethazine DM cough syrup at bedtime for cough and congestion.  It will make you drowsy so do not take it during the day.  Return for reevaluation or see your primary care provider for any new or worsening symptoms.

## 2023-06-10 DIAGNOSIS — M7062 Trochanteric bursitis, left hip: Secondary | ICD-10-CM | POA: Insufficient documentation

## 2023-08-12 IMAGING — CR DG ANKLE COMPLETE 3+V*R*
3 series · 3 of 3 positions shown · non-contrast
Comparison: None.

CLINICAL DATA: Acute right ankle pain

EXAM:
RIGHT ANKLE - COMPLETE 3+ VIEW

[ankle ap]
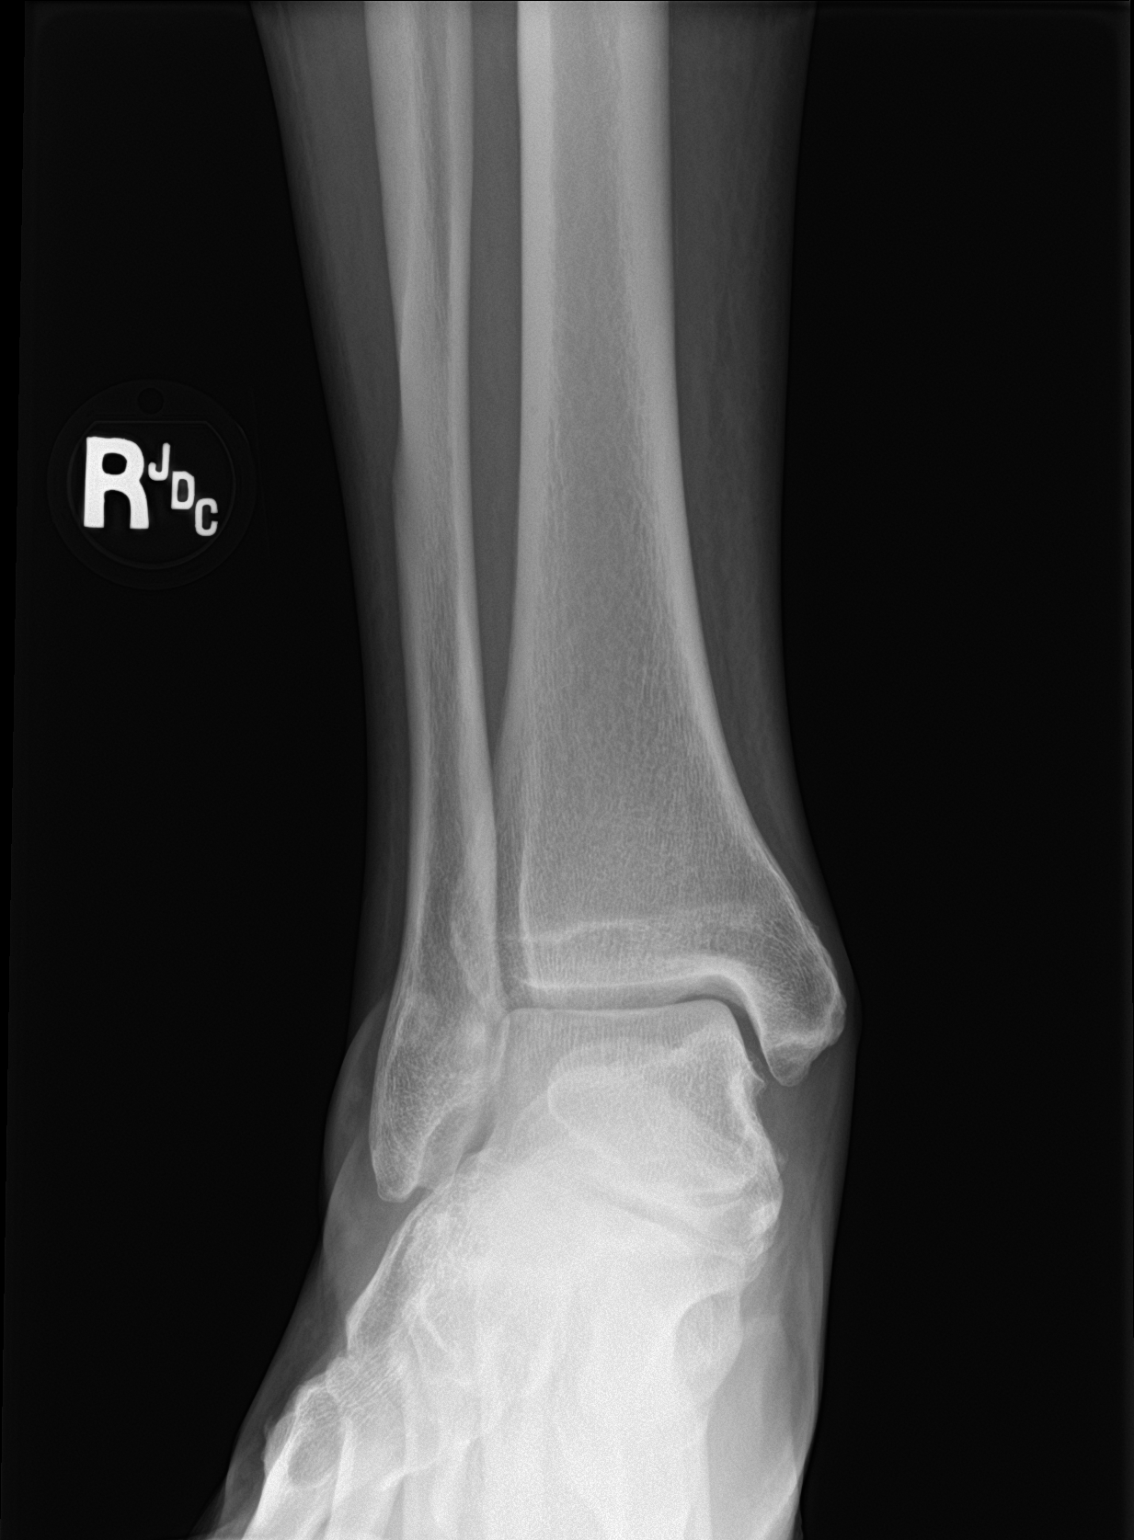

[ankle obl]
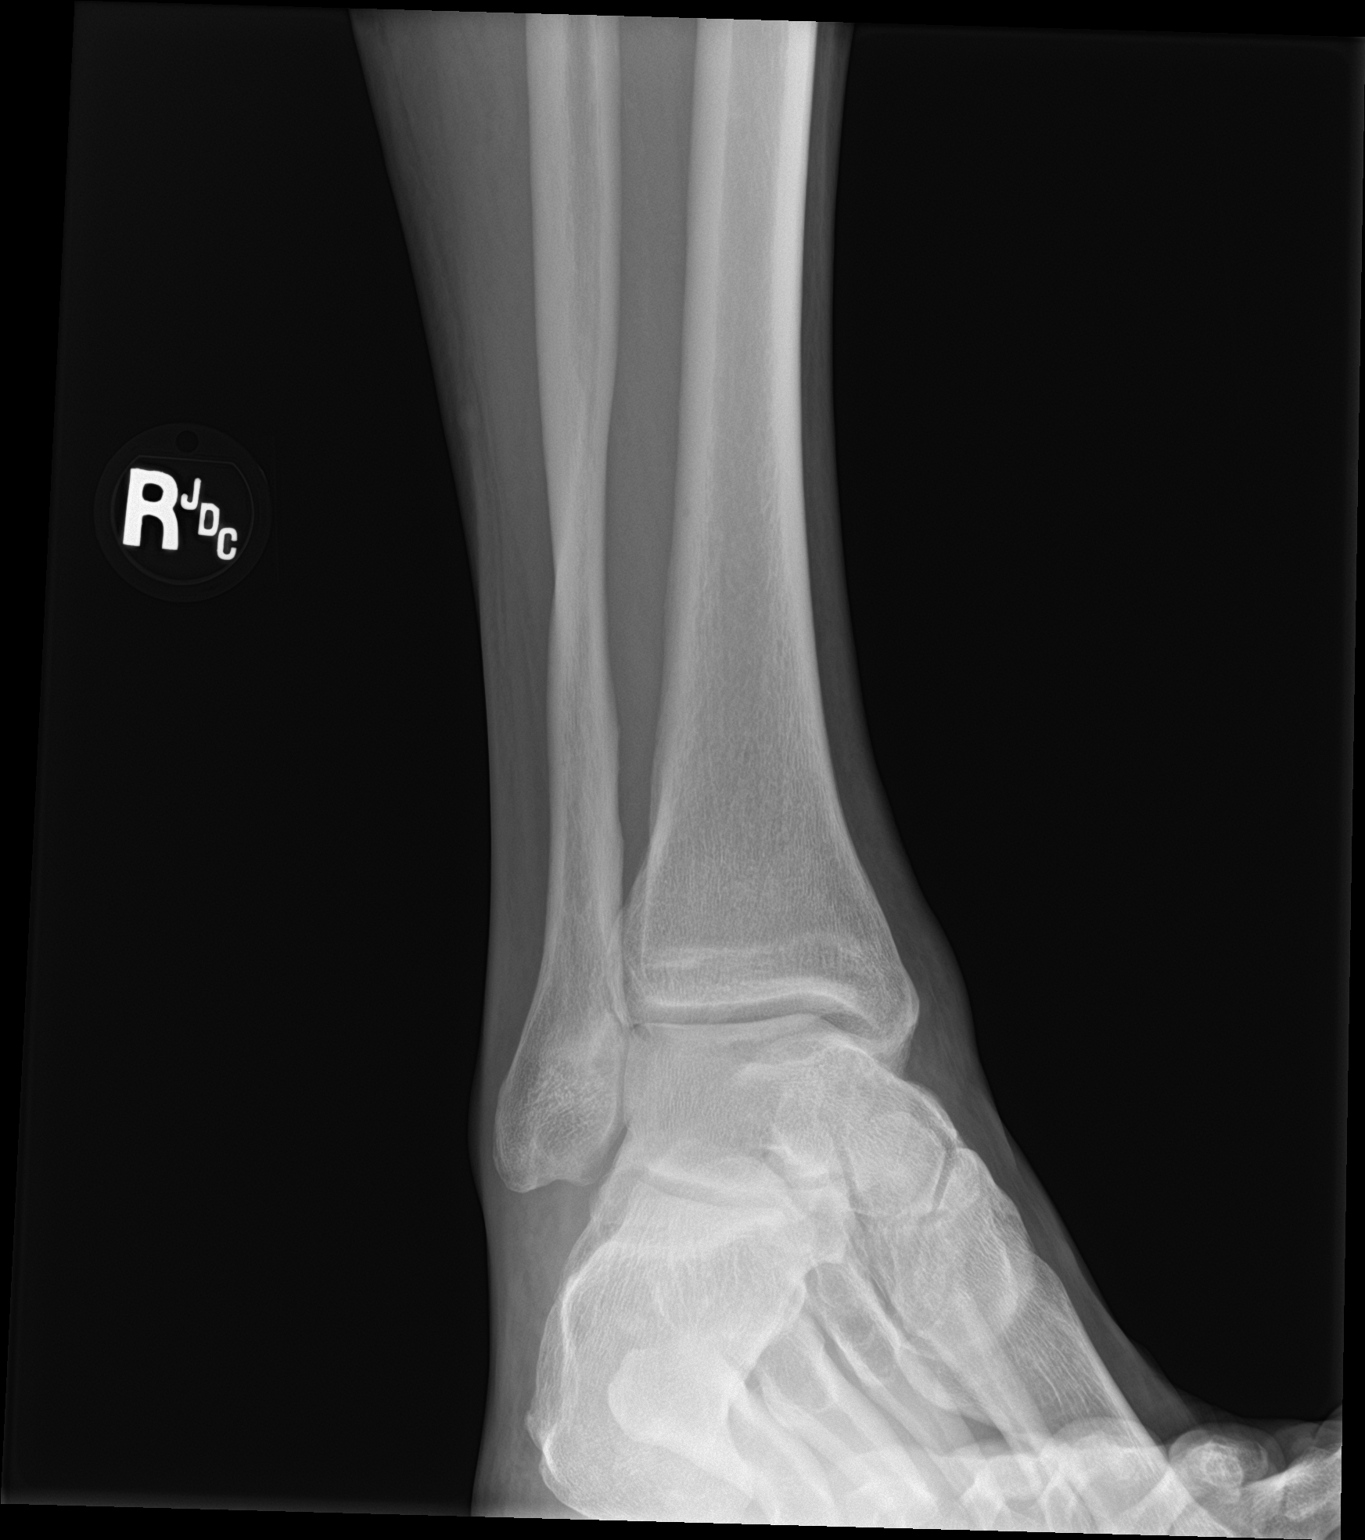

[ankle lat]
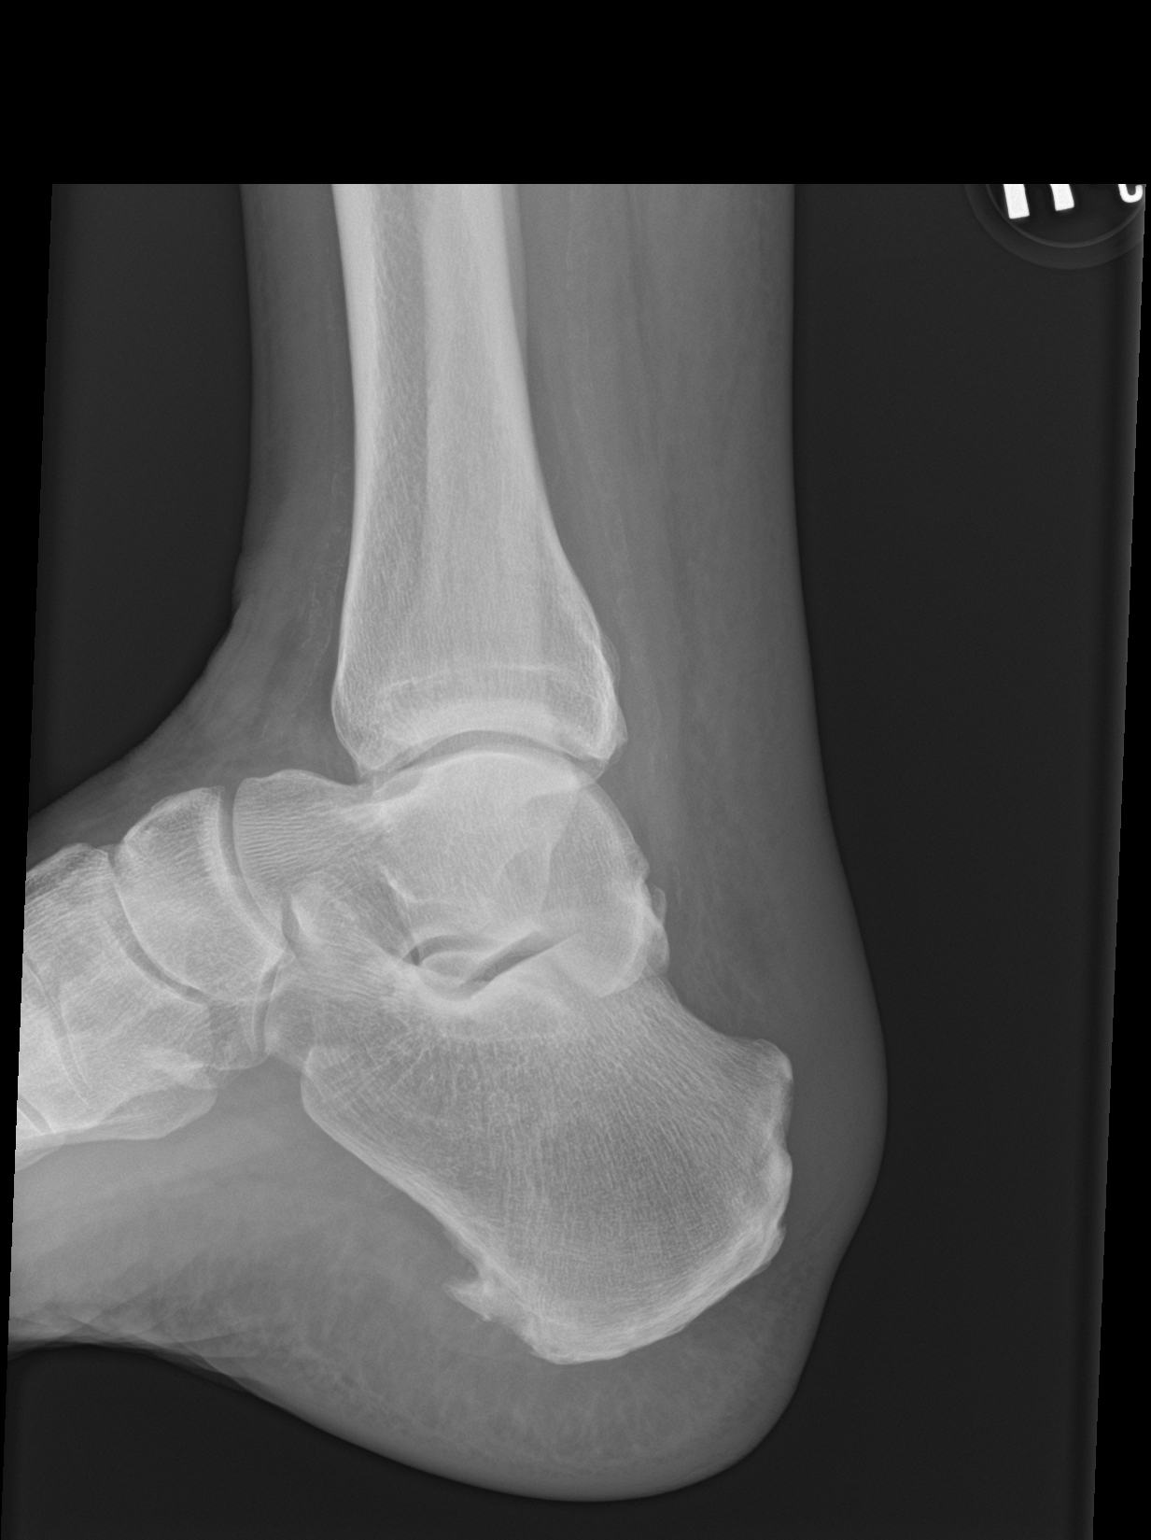

[3 of 3 positions shown; findings below may reference images not displayed]

FINDINGS: The ankle joint itself appears normal. There is swelling in the
region of the Achilles tendon that could go along with tendinitis or
Achilles tendon tear. Insignificant small plantar calcaneal spur.
IMPRESSION: No acute bone finding. Abnormal soft tissue swelling in the Achilles
region that could go along with tendinitis or an Achilles tendon
tear.

## 2023-10-17 DIAGNOSIS — E11621 Type 2 diabetes mellitus with foot ulcer: Secondary | ICD-10-CM | POA: Insufficient documentation

## 2023-10-17 DIAGNOSIS — B351 Tinea unguium: Secondary | ICD-10-CM | POA: Insufficient documentation

## 2023-10-17 DIAGNOSIS — R29898 Other symptoms and signs involving the musculoskeletal system: Secondary | ICD-10-CM | POA: Insufficient documentation

## 2023-10-20 ENCOUNTER — Ambulatory Visit
Admission: RE | Admit: 2023-10-20 | Discharge: 2023-10-20 | Disposition: A | Discharge status: Home / Self Care | Payer: 59 | Source: Ambulatory Visit | Attending: Physician Assistant

## 2023-10-20 ENCOUNTER — Encounter: Payer: 59 | Attending: Physician Assistant | Admitting: Physician Assistant

## 2023-10-20 ENCOUNTER — Other Ambulatory Visit: Payer: Self-pay | Admitting: Physician Assistant

## 2023-10-20 ENCOUNTER — Ambulatory Visit
Admission: RE | Admit: 2023-10-20 | Discharge: 2023-10-20 | Disposition: A | Discharge status: Home / Self Care | Payer: 59 | Source: Ambulatory Visit | Attending: Physician Assistant | Admitting: Physician Assistant

## 2023-10-20 DIAGNOSIS — B999 Unspecified infectious disease: Secondary | ICD-10-CM

## 2023-10-20 DIAGNOSIS — E11621 Type 2 diabetes mellitus with foot ulcer: Secondary | ICD-10-CM | POA: Insufficient documentation

## 2023-10-20 DIAGNOSIS — L97422 Non-pressure chronic ulcer of left heel and midfoot with fat layer exposed: Secondary | ICD-10-CM | POA: Insufficient documentation

## 2023-10-25 ENCOUNTER — Other Ambulatory Visit: Payer: Self-pay | Admitting: Physician Assistant

## 2023-10-25 DIAGNOSIS — L97422 Non-pressure chronic ulcer of left heel and midfoot with fat layer exposed: Secondary | ICD-10-CM

## 2023-10-25 DIAGNOSIS — E11621 Type 2 diabetes mellitus with foot ulcer: Secondary | ICD-10-CM

## 2023-10-31 ENCOUNTER — Ambulatory Visit: Admission: RE | Admit: 2023-10-31 | Payer: 59 | Source: Ambulatory Visit

## 2023-11-01 ENCOUNTER — Other Ambulatory Visit (INDEPENDENT_AMBULATORY_CARE_PROVIDER_SITE_OTHER): Payer: Self-pay | Admitting: Physician Assistant

## 2023-11-01 ENCOUNTER — Encounter: Payer: 59 | Admitting: Physician Assistant

## 2023-11-01 DIAGNOSIS — E11621 Type 2 diabetes mellitus with foot ulcer: Secondary | ICD-10-CM

## 2023-11-01 DIAGNOSIS — L97422 Non-pressure chronic ulcer of left heel and midfoot with fat layer exposed: Secondary | ICD-10-CM

## 2023-11-02 ENCOUNTER — Ambulatory Visit (INDEPENDENT_AMBULATORY_CARE_PROVIDER_SITE_OTHER): Payer: Self-pay

## 2023-11-02 DIAGNOSIS — E11621 Type 2 diabetes mellitus with foot ulcer: Secondary | ICD-10-CM

## 2023-11-02 DIAGNOSIS — L97422 Non-pressure chronic ulcer of left heel and midfoot with fat layer exposed: Secondary | ICD-10-CM | POA: Diagnosis not present

## 2023-11-02 DIAGNOSIS — M869 Osteomyelitis, unspecified: Secondary | ICD-10-CM

## 2023-11-02 DIAGNOSIS — L97509 Non-pressure chronic ulcer of other part of unspecified foot with unspecified severity: Secondary | ICD-10-CM

## 2023-11-02 DIAGNOSIS — E1169 Type 2 diabetes mellitus with other specified complication: Secondary | ICD-10-CM

## 2023-11-15 ENCOUNTER — Encounter: Payer: 59 | Attending: Physician Assistant | Admitting: Physician Assistant

## 2023-11-15 DIAGNOSIS — E11621 Type 2 diabetes mellitus with foot ulcer: Secondary | ICD-10-CM | POA: Insufficient documentation

## 2023-11-15 DIAGNOSIS — L97422 Non-pressure chronic ulcer of left heel and midfoot with fat layer exposed: Secondary | ICD-10-CM | POA: Insufficient documentation

## 2023-11-15 DIAGNOSIS — E7800 Pure hypercholesterolemia, unspecified: Secondary | ICD-10-CM | POA: Insufficient documentation

## 2023-11-17 ENCOUNTER — Other Ambulatory Visit: Payer: Self-pay | Admitting: Physician Assistant

## 2023-11-17 DIAGNOSIS — E11621 Type 2 diabetes mellitus with foot ulcer: Secondary | ICD-10-CM

## 2023-11-22 ENCOUNTER — Encounter: Admitting: Physician Assistant

## 2023-11-22 DIAGNOSIS — E11621 Type 2 diabetes mellitus with foot ulcer: Secondary | ICD-10-CM | POA: Diagnosis not present

## 2023-11-29 ENCOUNTER — Encounter: Admitting: Physician Assistant

## 2023-11-29 DIAGNOSIS — E11621 Type 2 diabetes mellitus with foot ulcer: Secondary | ICD-10-CM | POA: Diagnosis not present

## 2023-12-02 ENCOUNTER — Other Ambulatory Visit

## 2023-12-06 ENCOUNTER — Ambulatory Visit: Admitting: Physician Assistant

## 2023-12-07 ENCOUNTER — Encounter: Attending: Physician Assistant | Admitting: Physician Assistant

## 2023-12-07 DIAGNOSIS — E7800 Pure hypercholesterolemia, unspecified: Secondary | ICD-10-CM | POA: Insufficient documentation

## 2023-12-07 DIAGNOSIS — E11621 Type 2 diabetes mellitus with foot ulcer: Secondary | ICD-10-CM | POA: Insufficient documentation

## 2023-12-07 DIAGNOSIS — L97422 Non-pressure chronic ulcer of left heel and midfoot with fat layer exposed: Secondary | ICD-10-CM | POA: Diagnosis not present

## 2023-12-14 ENCOUNTER — Encounter: Admitting: Physician Assistant

## 2023-12-14 DIAGNOSIS — E11621 Type 2 diabetes mellitus with foot ulcer: Secondary | ICD-10-CM | POA: Diagnosis not present

## 2023-12-21 ENCOUNTER — Encounter: Admitting: Physician Assistant

## 2023-12-21 DIAGNOSIS — E11621 Type 2 diabetes mellitus with foot ulcer: Secondary | ICD-10-CM | POA: Diagnosis not present

## 2023-12-29 ENCOUNTER — Encounter: Admitting: Physician Assistant

## 2023-12-29 DIAGNOSIS — E11621 Type 2 diabetes mellitus with foot ulcer: Secondary | ICD-10-CM | POA: Diagnosis not present

## 2024-01-04 ENCOUNTER — Encounter: Admitting: Physician Assistant

## 2024-01-04 DIAGNOSIS — E11621 Type 2 diabetes mellitus with foot ulcer: Secondary | ICD-10-CM | POA: Diagnosis not present

## 2024-01-05 ENCOUNTER — Ambulatory Visit: Admitting: Physician Assistant

## 2024-01-11 ENCOUNTER — Encounter: Attending: Physician Assistant | Admitting: Physician Assistant

## 2024-01-11 DIAGNOSIS — E11621 Type 2 diabetes mellitus with foot ulcer: Secondary | ICD-10-CM | POA: Insufficient documentation

## 2024-01-11 DIAGNOSIS — L97422 Non-pressure chronic ulcer of left heel and midfoot with fat layer exposed: Secondary | ICD-10-CM | POA: Insufficient documentation

## 2024-01-20 ENCOUNTER — Encounter: Admitting: Physician Assistant

## 2024-01-20 DIAGNOSIS — E11621 Type 2 diabetes mellitus with foot ulcer: Secondary | ICD-10-CM | POA: Diagnosis not present

## 2024-01-24 ENCOUNTER — Encounter (INDEPENDENT_AMBULATORY_CARE_PROVIDER_SITE_OTHER): Payer: Self-pay

## 2024-01-27 ENCOUNTER — Encounter: Admitting: Physician Assistant

## 2024-01-27 DIAGNOSIS — E11621 Type 2 diabetes mellitus with foot ulcer: Secondary | ICD-10-CM | POA: Diagnosis not present

## 2024-02-08 ENCOUNTER — Encounter: Attending: Physician Assistant | Admitting: Physician Assistant

## 2024-02-08 DIAGNOSIS — L97422 Non-pressure chronic ulcer of left heel and midfoot with fat layer exposed: Secondary | ICD-10-CM | POA: Diagnosis not present

## 2024-02-08 DIAGNOSIS — E11621 Type 2 diabetes mellitus with foot ulcer: Secondary | ICD-10-CM | POA: Diagnosis present

## 2024-02-16 ENCOUNTER — Encounter: Admitting: Physician Assistant

## 2024-02-16 DIAGNOSIS — E11621 Type 2 diabetes mellitus with foot ulcer: Secondary | ICD-10-CM | POA: Diagnosis not present

## 2024-02-22 ENCOUNTER — Encounter: Admitting: Internal Medicine

## 2024-02-22 DIAGNOSIS — E11621 Type 2 diabetes mellitus with foot ulcer: Secondary | ICD-10-CM | POA: Diagnosis not present

## 2024-03-06 ENCOUNTER — Encounter: Attending: Physician Assistant | Admitting: Physician Assistant

## 2024-03-06 DIAGNOSIS — L97422 Non-pressure chronic ulcer of left heel and midfoot with fat layer exposed: Secondary | ICD-10-CM | POA: Diagnosis not present

## 2024-03-06 DIAGNOSIS — E11621 Type 2 diabetes mellitus with foot ulcer: Secondary | ICD-10-CM | POA: Diagnosis present

## 2024-03-07 DIAGNOSIS — I6521 Occlusion and stenosis of right carotid artery: Secondary | ICD-10-CM | POA: Insufficient documentation

## 2024-03-07 DIAGNOSIS — Z8673 Personal history of transient ischemic attack (TIA), and cerebral infarction without residual deficits: Secondary | ICD-10-CM | POA: Insufficient documentation

## 2024-03-13 ENCOUNTER — Ambulatory Visit: Admitting: Physician Assistant

## 2024-03-30 DIAGNOSIS — I6521 Occlusion and stenosis of right carotid artery: Secondary | ICD-10-CM | POA: Insufficient documentation

## 2024-03-30 DIAGNOSIS — E1159 Type 2 diabetes mellitus with other circulatory complications: Secondary | ICD-10-CM | POA: Insufficient documentation

## 2024-03-30 DIAGNOSIS — K2970 Gastritis, unspecified, without bleeding: Secondary | ICD-10-CM | POA: Insufficient documentation

## 2024-03-30 NOTE — Telephone Encounter (Signed)
 Patient filled out his portion of the Disability Parking Placard. This is scanned in Media. Can you please finish your portion and sign. This is also in your folder.

## 2024-04-19 ENCOUNTER — Ambulatory Visit: Admission: EM | Admit: 2024-04-19 | Discharge: 2024-04-19 | Attending: Family Medicine | Admitting: Family Medicine

## 2024-04-19 DIAGNOSIS — R197 Diarrhea, unspecified: Secondary | ICD-10-CM

## 2024-04-19 DIAGNOSIS — R112 Nausea with vomiting, unspecified: Secondary | ICD-10-CM | POA: Diagnosis not present

## 2024-04-19 DIAGNOSIS — R634 Abnormal weight loss: Secondary | ICD-10-CM | POA: Diagnosis not present

## 2024-04-19 NOTE — ED Provider Notes (Signed)
 [facility]    CSN: [id number] Arrival date & time: 04/19/24  1027      History   Chief Complaint Chief Complaint  Patient presents with   Medication Reaction    HPI Philip Frye is a 63 y.o. male.   HPI  History obtained from the patient. Diesel presents for nausea, diarrhea and headache for the past 2 weeks.  He has history of an ulcer but stopped taking the medication for this on Friday.  No treatments prior to arrival.  Has history of TIA's. They prescribed Rephatha, Plavix and Zetia after an admission. He had an ulcer in his stomach. He started prantoprazole  but stopped it last Friday. Felt better for 2 days. However, diarrhea, vomiting and (20 lbs) weight loss as continued. Last night, he was so sick at work that he had to go home due to headache, diarrhea and vomiting.  He denies current abdominal pain.        Past Medical History:  Diagnosis Date   Diabetes (HCC)    Hyperlipidemia     Patient Active Problem List   Diagnosis Date Noted   Gastritis without bleeding 03/30/2024   Hypertension associated with diabetes (HCC) 03/30/2024   Stenosis of right carotid artery 03/30/2024   History of TIA (transient ischemic attack) 03/07/2024   ICAO (internal carotid artery occlusion), right 03/07/2024   Diabetic ulcer of left heel associated with type 2 diabetes mellitus (HCC) 10/17/2023   Onychomycosis of left great toe 10/17/2023   Weakness of left upper extremity 10/17/2023   Trochanteric bursitis of left hip 06/10/2023   Tendinitis 02/24/2023   Spontaneous rupture of plantar fascia of left foot 02/10/2023   Calcaneal spur of left foot 01/20/2023   Abnormal gait 10/13/2021   Muscle weakness 10/13/2021   Achilles tendinitis, right leg 07/13/2021   Calcaneal spur 07/13/2021   Open wound of left lower leg 07/13/2021   Peripheral venous insufficiency 07/13/2021   Strain of right Achilles tendon 07/13/2021   Routine health maintenance 05/18/2021    Obesity (BMI 30-39.9) 05/18/2021   Pain of left hip joint 12/30/2020   OSA on CPAP 05/05/2020   Overweight (BMI 25.0-29.9) 05/05/2020   Type 2 diabetes mellitus without complication (HCC) 02/13/2014   Class 1 obesity due to excess calories with serious comorbidity and body mass index (BMI) of 31.0 to 31.9 in adult 05/10/2013   Erectile dysfunction 05/10/2013   Hyperlipidemia associated with type 2 diabetes mellitus (HCC) 05/10/2013    Past Surgical History:  Procedure Laterality Date   ELBOW SURGERY Bilateral    FINGER SURGERY Left    index       Home Medications    Prior to Admission medications   Medication Sig Start Date End Date Taking? Authorizing Provider  aspirin 81 MG chewable tablet Chew 1 tablet (81 mg total) daily. 03/08/24 06/06/24 Yes [provider]  lisinopril (ZESTRIL) 10 MG tablet Take 1 tablet (10 mg total) by mouth daily. For high blood pressure 03/30/24 03/30/25 Yes [provider]  REPATHA SURECLICK 140 MG/ML SOAJ Inject 140 mg under the skin every fourteen (14) days. 03/28/24 04/27/24 Yes [provider]  benzonatate  (TESSALON ) 100 MG capsule Take 2 capsules (200 mg total) by mouth every 8 (eight) hours. 06/01/23   Bernardino Ditch, NP  EPINEPHrine  (EPIPEN  2-PAK) 0.3 mg/0.3 mL IJ SOAJ injection Inject 0.3 mg into the muscle as needed for anaphylaxis. 11/22/20   Claudene Arthea SQUIBB, MD  escitalopram (LEXAPRO) 10 MG tablet Take  by mouth. 04/28/21   [provider]  gabapentin (NEURONTIN) 100 MG capsule Take 300 mg by mouth at bedtime. 04/28/21   [provider]  ipratropium (ATROVENT ) 0.06 % nasal spray Place 2 sprays into both nostrils 4 (four) times daily. 06/01/23   Bernardino Ditch, NP  loratadine (CLARITIN) 10 MG tablet Take 1 tablet by mouth daily. 08/16/22   [provider]  OZEMPIC, 0.25 OR 0.5 MG/DOSE, 2 MG/1.5ML SOPN Inject 0.5 mg into the skin once a week. 05/04/21   [provider]   promethazine -dextromethorphan (PROMETHAZINE -DM) 6.25-15 MG/5ML syrup Take 5 mLs by mouth 4 (four) times daily as needed. 06/01/23   Bernardino Ditch, NP  rosuvastatin (CRESTOR) 20 MG tablet Take 20 mg by mouth daily.    [provider]  rosuvastatin (CRESTOR) 20 MG tablet Take 1 tablet by mouth daily. 04/28/20   [provider]  TRESIBA FLEXTOUCH 200 UNIT/ML FlexTouch Pen SMARTSIG:0-40 Unit(s) SUB-Q Daily 04/23/21   [provider]    Family History Family History  Problem Relation Age of Onset   Heart disease Mother    Parkinson's disease Mother    Dementia Mother    Heart disease Father    Kidney failure Father     Social History Social History   Tobacco Use   Smoking status: Never   Smokeless tobacco: Never  Vaping Use   Vaping status: Never Used  Substance Use Topics   Alcohol use: No   Drug use: No     Allergies   Clopidogrel, Metformin, and Ezetimibe   Review of Systems Review of Systems: negative unless otherwise stated in HPI.      Physical Exam Triage Vital Signs ED Triage Vitals  Encounter Vitals Group     BP 04/19/24 1100 112/80     Girls Systolic BP Percentile --      Girls Diastolic BP Percentile --      Boys Systolic BP Percentile --      Boys Diastolic BP Percentile --      Pulse Rate 04/19/24 1100 96     Resp 04/19/24 1100 16     Temp 04/19/24 1100 98.3 F (36.8 C)     Temp Source 04/19/24 1100 Oral     SpO2 04/19/24 1100 96 %     Weight 04/19/24 1057 215 lb (97.5 kg)     Height 04/19/24 1057 5' 11 (1.803 m)     Head Circumference --      Peak Flow --      Pain Score 04/19/24 1112 0     Pain Loc --      Pain Education --      Exclude from Growth Chart --    No data found.  Updated Vital Signs BP 112/80 (BP Location: Right Arm)   Pulse 96   Temp 98.3 F (36.8 C) (Oral)   Resp 16   Ht 5' 11 (1.803 m)   Wt 97.5 kg   SpO2 96%   BMI 29.99 kg/m   Visual Acuity Right Eye Distance:   Left Eye Distance:    Bilateral Distance:    Right Eye Near:   Left Eye Near:    Bilateral Near:     Physical Exam GEN:     alert, non-toxic appearing male in no distress    HENT:  mucus membranes moist, no nasal discharge EYES:   no scleral injection or icterus  RESP:  no increased work of breathing, clear to auscultation bilaterally CVS:  regular rate and rhythm ABD:   Soft, epigastric tender, nondistended, no guarding, no rebound, active bowel sounds throughout, negative McBurney's, negative Murphy's Skin:   warm and dry, no rash on visible skin    UC Treatments / Results  Labs (all labs ordered are listed, but only abnormal results are displayed) Labs Reviewed - No data to display  EKG   Radiology No results found.  Procedures Procedures (including critical care time)  Medications Ordered in UC Medications - No data to display  Initial Impression / Assessment and Plan / UC Course  I have reviewed the triage vital signs and the nursing notes.  Pertinent labs & imaging results that were available during my care of the patient were reviewed by me and considered in my medical decision making (see chart for details).       Pt is a 63 y.o. male who has history of ulcers, gastritis, type 2 diabetes, hyperlipidemia, hypertension, TIA, OSA presents for 2 weeks of GI symptoms with a headache.  [place] is afebrile here without recent antipyretics. Satting well on room air. Overall pt is non-toxic appearing, well hydrated, without respiratory distress.  Reviewed admission and discharge notes, labs and imaging available for his 03/12/2024 gastric perforation admission at [facility].  He reports unintended weight loss.  Abdominal  exam is remarkable for epigastric tenderness.  Given his prolonged symptoms and weight loss recommended ED evaluation.  Patient will travel via private vehicle to  [facility] emergency department for further evaluation.  He is stable for discharge.  Discussed MDM, treatment plan  and plan for follow-up with patient who agrees with plan.     Final Clinical Impressions(s) / UC Diagnoses   Final diagnoses:  Unintended weight loss  Nausea, vomiting, and diarrhea     Discharge Instructions      You have been advised to follow up immediately in the emergency department for concerning signs or symptoms as discussed during your visit. If you declined EMS transport, please have a family member take you directly to the ED at this time. Do not delay.   Based on concerns about condition, if you do not follow up in the ED, you may risk poor outcomes including worsening of condition, delayed treatment and potentially life threatening issues. If you have declined to go to the ED at this time, you should call your PCP immediately to set up a follow up appointment.      ED Prescriptions   None    PDMP not reviewed this encounter.   Drucella Karbowski, DO 04/26/24 1942

## 2024-04-19 NOTE — Discharge Instructions (Signed)

## 2024-04-19 NOTE — ED Notes (Addendum)
 Patient is being discharged from the Urgent Care and sent to the Emergency Department via POV . Per Dr.Brimage, patient is in need of higher level of care due to N/V & Wt loss. Patient is aware and verbalizes understanding of plan of care.  Vitals:   04/19/24 1100  BP: 112/80  Pulse: 96  Resp: 16  Temp: 98.3 F (36.8 C)  SpO2: 96%

## 2024-04-19 NOTE — ED Triage Notes (Signed)
 Pt c/o HA,nausea & diarrhea x2 wks. States was put on pantoprazole d/t hospital finding ulcer in abdomen. Stopped medicine last Friday. No OTC meds.

## 2024-04-20 ENCOUNTER — Encounter: Attending: Physician Assistant | Admitting: Physician Assistant

## 2024-04-20 DIAGNOSIS — Z8631 Personal history of diabetic foot ulcer: Secondary | ICD-10-CM | POA: Diagnosis not present

## 2024-04-20 DIAGNOSIS — Z09 Encounter for follow-up examination after completed treatment for conditions other than malignant neoplasm: Secondary | ICD-10-CM | POA: Insufficient documentation
